# Patient Record
Sex: Female | Born: 1935 | Race: White | Hispanic: No | Marital: Married | Smoking: Never smoker
Health system: Southern US, Community
[De-identification: ages and names within clinical notes are randomized; demographics above are authoritative.]

## PROBLEM LIST (undated history)

## (undated) DIAGNOSIS — T4145XA Adverse effect of unspecified anesthetic, initial encounter: Secondary | ICD-10-CM

## (undated) DIAGNOSIS — R0989 Other specified symptoms and signs involving the circulatory and respiratory systems: Secondary | ICD-10-CM

## (undated) DIAGNOSIS — E785 Hyperlipidemia, unspecified: Secondary | ICD-10-CM

## (undated) DIAGNOSIS — I10 Essential (primary) hypertension: Secondary | ICD-10-CM

## (undated) DIAGNOSIS — F0390 Unspecified dementia without behavioral disturbance: Secondary | ICD-10-CM

## (undated) DIAGNOSIS — I209 Angina pectoris, unspecified: Secondary | ICD-10-CM

## (undated) DIAGNOSIS — D649 Anemia, unspecified: Secondary | ICD-10-CM

## (undated) DIAGNOSIS — I259 Chronic ischemic heart disease, unspecified: Secondary | ICD-10-CM

## (undated) DIAGNOSIS — T8859XA Other complications of anesthesia, initial encounter: Secondary | ICD-10-CM

## (undated) HISTORY — PX: SKIN CANCER EXCISION: SHX779

## (undated) HISTORY — PX: NASAL RECONSTRUCTION: SHX2069

## (undated) HISTORY — DX: Chronic ischemic heart disease, unspecified: I25.9

## (undated) HISTORY — PX: CORONARY ARTERY BYPASS GRAFT: SHX141

## (undated) HISTORY — DX: Unspecified dementia, unspecified severity, without behavioral disturbance, psychotic disturbance, mood disturbance, and anxiety: F03.90

## (undated) HISTORY — PX: COLON SURGERY: SHX602

## (undated) HISTORY — DX: Other specified symptoms and signs involving the circulatory and respiratory systems: R09.89

## (undated) HISTORY — DX: Essential (primary) hypertension: I10

## (undated) HISTORY — PX: OTHER SURGICAL HISTORY: SHX169

## (undated) HISTORY — DX: Hyperlipidemia, unspecified: E78.5

---

## 2005-02-01 ENCOUNTER — Emergency Department (HOSPITAL_COMMUNITY): Admission: EM | Admit: 2005-02-01 | Discharge: 2005-02-02 | Payer: Self-pay | Admitting: Emergency Medicine

## 2005-08-09 ENCOUNTER — Emergency Department (HOSPITAL_COMMUNITY): Admission: EM | Admit: 2005-08-09 | Discharge: 2005-08-09 | Payer: Self-pay | Admitting: Emergency Medicine

## 2006-05-13 ENCOUNTER — Encounter: Admission: RE | Admit: 2006-05-13 | Discharge: 2006-05-13 | Payer: Self-pay | Admitting: Cardiology

## 2009-06-11 ENCOUNTER — Encounter: Admission: RE | Admit: 2009-06-11 | Discharge: 2009-06-11 | Payer: Self-pay | Admitting: Cardiology

## 2010-11-25 ENCOUNTER — Ambulatory Visit (INDEPENDENT_AMBULATORY_CARE_PROVIDER_SITE_OTHER): Payer: Medicare Other | Admitting: Cardiology

## 2010-11-25 DIAGNOSIS — E7800 Pure hypercholesterolemia, unspecified: Secondary | ICD-10-CM

## 2010-11-25 DIAGNOSIS — Z79899 Other long term (current) drug therapy: Secondary | ICD-10-CM

## 2010-11-25 DIAGNOSIS — E119 Type 2 diabetes mellitus without complications: Secondary | ICD-10-CM

## 2011-05-05 ENCOUNTER — Other Ambulatory Visit: Payer: Self-pay | Admitting: *Deleted

## 2011-05-05 DIAGNOSIS — E785 Hyperlipidemia, unspecified: Secondary | ICD-10-CM

## 2011-05-05 MED ORDER — EZETIMIBE 10 MG PO TABS: 10.0000 mg | ORAL_TABLET | Freq: Every day | ORAL | Status: DC

## 2011-05-05 NOTE — Telephone Encounter (Signed)
Refilled zetia to [facility]

## 2011-05-07 ENCOUNTER — Ambulatory Visit (INDEPENDENT_AMBULATORY_CARE_PROVIDER_SITE_OTHER): Payer: Medicare Other

## 2011-05-07 ENCOUNTER — Inpatient Hospital Stay (INDEPENDENT_AMBULATORY_CARE_PROVIDER_SITE_OTHER)
Admission: RE | Admit: 2011-05-07 | Discharge: 2011-05-07 | Disposition: A | Discharge status: Home / Self Care | Payer: Medicare Other | Source: Ambulatory Visit | Attending: Emergency Medicine | Admitting: Emergency Medicine

## 2011-05-07 DIAGNOSIS — S60219A Contusion of unspecified wrist, initial encounter: Secondary | ICD-10-CM

## 2011-05-20 ENCOUNTER — Telehealth: Payer: Self-pay | Admitting: Cardiology

## 2011-05-20 NOTE — Telephone Encounter (Signed)
Last OV Note, latest labs available all faxed today

## 2011-05-20 NOTE — Telephone Encounter (Signed)
Need to fax OV Note, latest labs

## 2011-05-22 ENCOUNTER — Telehealth: Payer: Self-pay | Admitting: Cardiology

## 2011-05-22 NOTE — Telephone Encounter (Signed)
Heather at Dr. Debro[address] office called stating she did not receive last OV and Labs.  Patient has appointment on Monday and they are closed on Friday 05/23/2011.  Please refax to [phone].

## 2011-05-23 NOTE — Telephone Encounter (Signed)
Refaxed last OV Note and Labs available today to [phone].

## 2011-07-07 ENCOUNTER — Other Ambulatory Visit: Payer: Self-pay | Admitting: *Deleted

## 2011-07-08 ENCOUNTER — Other Ambulatory Visit: Payer: Self-pay | Admitting: *Deleted

## 2011-07-08 MED ORDER — FENOFIBRATE 160 MG PO TABS: 160.0000 mg | ORAL_TABLET | Freq: Every day | ORAL | Status: DC

## 2011-08-07 ENCOUNTER — Other Ambulatory Visit: Payer: Self-pay | Admitting: Cardiology

## 2011-09-15 ENCOUNTER — Encounter: Payer: Self-pay | Admitting: *Deleted

## 2011-09-15 ENCOUNTER — Other Ambulatory Visit: Payer: Self-pay

## 2011-09-15 ENCOUNTER — Emergency Department (HOSPITAL_COMMUNITY): Payer: Medicare Other

## 2011-09-15 ENCOUNTER — Inpatient Hospital Stay (HOSPITAL_COMMUNITY)
Admission: EM | Admit: 2011-09-15 | Discharge: 2011-09-22 | DRG: 481 | Disposition: A | Discharge status: Skilled Nursing Facility | Payer: Medicare Other | Attending: Internal Medicine | Admitting: Internal Medicine

## 2011-09-15 DIAGNOSIS — N179 Acute kidney failure, unspecified: Secondary | ICD-10-CM | POA: Diagnosis present

## 2011-09-15 DIAGNOSIS — S72141A Displaced intertrochanteric fracture of right femur, initial encounter for closed fracture: Secondary | ICD-10-CM | POA: Diagnosis present

## 2011-09-15 DIAGNOSIS — F05 Delirium due to known physiological condition: Secondary | ICD-10-CM | POA: Diagnosis present

## 2011-09-15 DIAGNOSIS — F0150 Vascular dementia without behavioral disturbance: Secondary | ICD-10-CM | POA: Diagnosis present

## 2011-09-15 DIAGNOSIS — I129 Hypertensive chronic kidney disease with stage 1 through stage 4 chronic kidney disease, or unspecified chronic kidney disease: Secondary | ICD-10-CM | POA: Diagnosis present

## 2011-09-15 DIAGNOSIS — R2681 Unsteadiness on feet: Secondary | ICD-10-CM | POA: Diagnosis present

## 2011-09-15 DIAGNOSIS — I2510 Atherosclerotic heart disease of native coronary artery without angina pectoris: Secondary | ICD-10-CM | POA: Diagnosis present

## 2011-09-15 DIAGNOSIS — D631 Anemia in chronic kidney disease: Secondary | ICD-10-CM | POA: Diagnosis present

## 2011-09-15 DIAGNOSIS — S72001A Fracture of unspecified part of neck of right femur, initial encounter for closed fracture: Secondary | ICD-10-CM

## 2011-09-15 DIAGNOSIS — E870 Hyperosmolality and hypernatremia: Secondary | ICD-10-CM | POA: Diagnosis present

## 2011-09-15 DIAGNOSIS — Z951 Presence of aortocoronary bypass graft: Secondary | ICD-10-CM

## 2011-09-15 DIAGNOSIS — Z9861 Coronary angioplasty status: Secondary | ICD-10-CM

## 2011-09-15 DIAGNOSIS — D649 Anemia, unspecified: Secondary | ICD-10-CM | POA: Diagnosis present

## 2011-09-15 DIAGNOSIS — Y92009 Unspecified place in unspecified non-institutional (private) residence as the place of occurrence of the external cause: Secondary | ICD-10-CM

## 2011-09-15 DIAGNOSIS — W010XXA Fall on same level from slipping, tripping and stumbling without subsequent striking against object, initial encounter: Secondary | ICD-10-CM | POA: Diagnosis present

## 2011-09-15 DIAGNOSIS — E876 Hypokalemia: Secondary | ICD-10-CM | POA: Diagnosis not present

## 2011-09-15 DIAGNOSIS — N183 Chronic kidney disease, stage 3 unspecified: Secondary | ICD-10-CM | POA: Diagnosis present

## 2011-09-15 DIAGNOSIS — I25810 Atherosclerosis of coronary artery bypass graft(s) without angina pectoris: Secondary | ICD-10-CM | POA: Diagnosis present

## 2011-09-15 DIAGNOSIS — Z66 Do not resuscitate: Secondary | ICD-10-CM | POA: Diagnosis present

## 2011-09-15 DIAGNOSIS — F0151 Vascular dementia with behavioral disturbance: Secondary | ICD-10-CM | POA: Diagnosis present

## 2011-09-15 DIAGNOSIS — I672 Cerebral atherosclerosis: Secondary | ICD-10-CM | POA: Diagnosis present

## 2011-09-15 DIAGNOSIS — S72143A Displaced intertrochanteric fracture of unspecified femur, initial encounter for closed fracture: Principal | ICD-10-CM | POA: Diagnosis present

## 2011-09-15 DIAGNOSIS — E119 Type 2 diabetes mellitus without complications: Secondary | ICD-10-CM | POA: Diagnosis present

## 2011-09-15 DIAGNOSIS — I10 Essential (primary) hypertension: Secondary | ICD-10-CM | POA: Diagnosis present

## 2011-09-15 HISTORY — DX: Adverse effect of unspecified anesthetic, initial encounter: T41.45XA

## 2011-09-15 HISTORY — DX: Other complications of anesthesia, initial encounter: T88.59XA

## 2011-09-15 HISTORY — DX: Angina pectoris, unspecified: I20.9

## 2011-09-15 HISTORY — DX: Anemia, unspecified: D64.9

## 2011-09-15 LAB — COMPREHENSIVE METABOLIC PANEL
ALT: 21 U/L (ref 0–35)
AST: 43 U/L — ABNORMAL HIGH (ref 0–37)
Albumin: 2.8 g/dL — ABNORMAL LOW (ref 3.5–5.2)
Alkaline Phosphatase: 51 U/L (ref 39–117)
BUN: 40 mg/dL — ABNORMAL HIGH (ref 6–23)
CO2: 24 mEq/L (ref 19–32)
Calcium: 9.3 mg/dL (ref 8.4–10.5)
Creatinine, Ser: 1.03 mg/dL (ref 0.50–1.10)
GFR calc Af Amer: 53 mL/min — ABNORMAL LOW (ref >90)
GFR calc non Af Amer: 46 mL/min — ABNORMAL LOW (ref >90)
Glucose, Bld: 242 mg/dL — ABNORMAL HIGH (ref 70–99)
Potassium: 3.7 mEq/L (ref 3.5–5.1)
Sodium: 146 mEq/L — ABNORMAL HIGH (ref 135–145)
Total Bilirubin: 0.2 mg/dL — ABNORMAL LOW (ref 0.3–1.2)
Total Protein: 6.2 g/dL (ref 6.0–8.3)

## 2011-09-15 LAB — URINALYSIS, ROUTINE W REFLEX MICROSCOPIC
Bilirubin Urine: NEGATIVE
Hgb urine dipstick: NEGATIVE
Leukocytes, UA: NEGATIVE
Nitrite: NEGATIVE
Specific Gravity, Urine: 1.021 (ref 1.005–1.030)
Urobilinogen, UA: 0.2 mg/dL (ref 0.0–1.0)
pH: 5 (ref 5.0–8.0)

## 2011-09-15 LAB — DIFFERENTIAL
Basophils Absolute: 0 10*3/uL (ref 0.0–0.1)
Basophils Relative: 0 % (ref 0–1)
Eosinophils Absolute: 0 10*3/uL (ref 0.0–0.7)
Eosinophils Relative: 0 % (ref 0–5)
Lymphocytes Relative: 16 % (ref 12–46)
Lymphs Abs: 1.2 10*3/uL (ref 0.7–4.0)
Monocytes Absolute: 0.8 10*3/uL (ref 0.1–1.0)
Monocytes Relative: 11 % (ref 3–12)
Neutro Abs: 5.6 10*3/uL (ref 1.7–7.7)

## 2011-09-15 LAB — CBC
HCT: 31.9 % — ABNORMAL LOW (ref 36.0–46.0)
MCH: 29.2 pg (ref 26.0–34.0)
MCV: 88.9 fL (ref 78.0–100.0)
Platelets: 208 10*3/uL (ref 150–400)
RBC: 3.59 MIL/uL — ABNORMAL LOW (ref 3.87–5.11)
RDW: 13.5 % (ref 11.5–15.5)
WBC: 7.7 10*3/uL (ref 4.0–10.5)

## 2011-09-15 LAB — URINE MICROSCOPIC-ADD ON

## 2011-09-15 LAB — POCT I-STAT TROPONIN I

## 2011-09-15 LAB — CARDIAC PANEL(CRET KIN+CKTOT+MB+TROPI)
CK, MB: 2.6 ng/mL (ref 0.3–4.0)
Relative Index: 1.6 (ref 0.0–2.5)
Troponin I: <0.3 ng/mL (ref <0.30)

## 2011-09-15 MED ORDER — VITAMINS A & D EX OINT
TOPICAL_OINTMENT | CUTANEOUS | Status: AC
  Administered 2011-09-15: 22:00:00
  Filled 2011-09-15: qty 5

## 2011-09-15 MED ORDER — INSULIN ASPART 100 UNIT/ML ~~LOC~~ SOLN
0.0000 [IU] | SUBCUTANEOUS | Status: DC
  Administered 2011-09-15 – 2011-09-16 (×2): 2 [IU] via SUBCUTANEOUS
  Administered 2011-09-16: 1 [IU] via SUBCUTANEOUS
  Administered 2011-09-16: 3 [IU] via SUBCUTANEOUS
  Administered 2011-09-17 (×4): 2 [IU] via SUBCUTANEOUS
  Filled 2011-09-15 (×3): qty 3

## 2011-09-15 MED ORDER — ACETAMINOPHEN 650 MG RE SUPP: 650.0000 mg | Freq: Four times a day (QID) | RECTAL | Status: DC | PRN

## 2011-09-15 MED ORDER — LORAZEPAM 2 MG/ML IJ SOLN
INTRAMUSCULAR | Status: AC
  Filled 2011-09-15: qty 1

## 2011-09-15 MED ORDER — ONDANSETRON HCL 4 MG/2ML IJ SOLN: 4.0000 mg | Freq: Four times a day (QID) | INTRAMUSCULAR | Status: DC | PRN

## 2011-09-15 MED ORDER — LORAZEPAM 2 MG/ML IJ SOLN
0.5000 mg | Freq: Once | INTRAMUSCULAR | Status: AC
  Administered 2011-09-15: 0.5 mg via INTRAVENOUS

## 2011-09-15 MED ORDER — HYDRALAZINE HCL 20 MG/ML IJ SOLN
5.0000 mg | INTRAMUSCULAR | Status: DC | PRN
  Administered 2011-09-15 – 2011-09-20 (×12): 5 mg via INTRAVENOUS
  Filled 2011-09-15 (×12): qty 1

## 2011-09-15 MED ORDER — MORPHINE SULFATE 2 MG/ML IJ SOLN
2.0000 mg | INTRAMUSCULAR | Status: DC | PRN
  Filled 2011-09-15: qty 1

## 2011-09-15 MED ORDER — SODIUM CHLORIDE 0.9 % IV SOLN: Freq: Once | INTRAVENOUS | Status: DC

## 2011-09-15 MED ORDER — ACETAMINOPHEN 325 MG PO TABS: 650.0000 mg | ORAL_TABLET | Freq: Four times a day (QID) | ORAL | Status: DC | PRN

## 2011-09-15 MED ORDER — PANTOPRAZOLE SODIUM 40 MG IV SOLR
40.0000 mg | Freq: Every day | INTRAVENOUS | Status: DC
  Administered 2011-09-15 – 2011-09-19 (×5): 40 mg via INTRAVENOUS
  Filled 2011-09-15 (×8): qty 40

## 2011-09-15 MED ORDER — ONDANSETRON HCL 4 MG/2ML IJ SOLN
INTRAMUSCULAR | Status: AC
  Filled 2011-09-15: qty 2

## 2011-09-15 MED ORDER — LORAZEPAM 2 MG/ML IJ SOLN
1.0000 mg | INTRAMUSCULAR | Status: DC | PRN
  Filled 2011-09-15: qty 1

## 2011-09-15 MED ORDER — METOPROLOL TARTRATE 1 MG/ML IV SOLN
5.0000 mg | Freq: Four times a day (QID) | INTRAVENOUS | Status: DC
  Administered 2011-09-15 – 2011-09-16 (×2): 5 mg via INTRAVENOUS
  Filled 2011-09-15 (×7): qty 5

## 2011-09-15 MED ORDER — LORAZEPAM 0.5 MG PO TABS: 0.5000 mg | ORAL_TABLET | Freq: Once | ORAL | Status: DC

## 2011-09-15 MED ORDER — MEMANTINE HCL 10 MG PO TABS
10.0000 mg | ORAL_TABLET | Freq: Two times a day (BID) | ORAL | Status: DC
  Administered 2011-09-16 – 2011-09-22 (×12): 10 mg via ORAL
  Filled 2011-09-15 (×17): qty 1

## 2011-09-15 MED ORDER — MORPHINE SULFATE 2 MG/ML IJ SOLN
2.0000 mg | INTRAMUSCULAR | Status: DC | PRN
  Administered 2011-09-15: 2 mg via INTRAVENOUS
  Filled 2011-09-15: qty 1

## 2011-09-15 MED ORDER — NALOXONE HCL 1 MG/ML IJ SOLN
INTRAMUSCULAR | Status: AC
  Filled 2011-09-15: qty 2

## 2011-09-15 MED ORDER — DEXTROSE-NACL 5-0.9 % IV SOLN
INTRAVENOUS | Status: DC
  Administered 2011-09-16: 03:00:00 via INTRAVENOUS

## 2011-09-15 MED ORDER — ALBUTEROL SULFATE (5 MG/ML) 0.5% IN NEBU: 2.5000 mg | INHALATION_SOLUTION | RESPIRATORY_TRACT | Status: DC | PRN

## 2011-09-15 MED ORDER — ONDANSETRON HCL 4 MG PO TABS: 4.0000 mg | ORAL_TABLET | Freq: Four times a day (QID) | ORAL | Status: DC | PRN

## 2011-09-15 NOTE — ED Notes (Signed)
Per daughter pt fell on Friday on wood floor. Pt c/o right hip pain and has been in bed since. Pt has shortening and rotation to right hip. Pt given 100 mcg of Fentanyl and 4mg  Zofran IVP in route. Pt had period of apnea and given Narcan 2mg  IVP. Pt's vital signs per EMS 200/90BP, 95HR, 16R, 98% RA. Alert and oriented per norm.

## 2011-09-15 NOTE — H&P (Signed)
PCP:   Drucie Opitz (patient was seeing Dr. Patty Sermons up until the last few months)  Chief Complaint:  Fall  HPI: Patient is a 77 year old white female with past oral history of diabetes, hypertension, CAD and advanced Alzheimer's dementia who lives at home with her daughter and comes in after she had a recent fall. Normally the patient is able to ambulate without any assistance or use of a walker, but she had a hard fall approximately one to 2 weeks ago landing on the wooden floor. The daughter feels that since that time she's gotten weaker and has been using a walker since. She's had a few near falls, but this culminated with a bad fall on her right side on Friday, 3 days ago. The daughter was initially resistant to bring in the patient, but she noted that her mom seemed beginning weaker and more confused. She also noted that her mom looked to have some garbled speech. Patient was brought into the emergency room for further evaluation.  In the emergency room, patient was found by x-ray to have a right intertrochanteric comminuted fracture. Her CT scan of the head was negative for any stroke, only showing signs of chronic wear and tear. Her labs noted a sodium of 146, a glucose of 242 and a troponin of 0.16. Please note the troponin was a point-of-care marker. Regular cardiac markers have been ordered and the results are pending.  Review of Systems:  When I saw the patient, she looks to be somewhat delirious and her speech is quite garbled. She interacts with me a little, but does not follow any commands. Unable to get any kind of review of systems. The entire history is provided by her daughter.  Past Medical History: Past Medical History  Diagnosis Date  . Angina   . Anemia   . Diabetes mellitus    Past Surgical History  Procedure Date  . Coronary artery bypass graft     1984  . Coronary stents     4 all together  . Colon surgery approx. 1992    colon tumor benign  . Skin cancer  excision     face - squamous cell  . Nasal reconstruction     rebuilt due to squamous cell ca    Medications: Prior to Admission medications   Medication Sig Start Date End Date Taking? Authorizing Provider  B Complex Vitamins (VITAMIN B COMPLEX PO) Take 1 tablet by mouth daily.     Yes Historical Provider, MD  BIOTIN PO Take 1 tablet by mouth daily.     Yes Historical Provider, MD  Coenzyme Q10 (COQ10 PO) Take 1 tablet by mouth daily.     Yes Historical Provider, MD  diltiazem (DILACOR XR) 180 MG 24 hr capsule Take 180 mg by mouth daily.     Yes Historical Provider, MD  enalapril (VASOTEC) 20 MG tablet Take 20 mg by mouth 2 (two) times daily.     Yes Historical Provider, MD  fenofibrate 160 MG tablet Take 1 tablet (160 mg total) by mouth daily. 07/08/11  Yes Cassell Clement, MD  insulin glargine (LANTUS) 100 UNIT/ML injection Inject 10 Units into the skin at bedtime.     Yes Historical Provider, MD  metoprolol tartrate (LOPRESSOR) 25 MG tablet Take 25-50 mg by mouth 2 (two) times daily. 2 in am, 1 in pm    Yes Historical Provider, MD  NAMENDA 10 MG tablet TAKE 1 TABLET TWICE DAILY. 08/07/11  Yes Cassell Clement, MD  nitroGLYCERIN (  NITRODUR - DOSED IN MG/24 HR) 0.4 mg/hr Place 3 patches onto the skin daily.     Yes Historical Provider, MD  nitroGLYCERIN (NITROSTAT) 0.4 MG SL tablet Place 0.4 mg under the tongue every 5 (five) minutes x 3 doses as needed. For chest pain.    Yes Historical Provider, MD  omeprazole (PRILOSEC) 20 MG capsule Take 20 mg by mouth daily.     Yes Historical Provider, MD    Allergies:  No Known Allergies  Social History:  reports that she has never smoked. She has never used smokeless tobacco. She reports that she does not drink alcohol or use illicit drugs. the patient lives at home with her daughter. Prior to these falls, she was able to ambulate comfortably without any assistance.  Family History: Dementia, CAD  Physical Exam: Filed Vitals:   09/15/11 1244  09/15/11 1245  BP: 172/81   Pulse: 117   Temp: 98.4 F (36.9 C)   TempSrc: Oral   Resp: 16   SpO2: 85% 92%   General: Alert but only oriented to self. Does not look to be in any acute distress. Looks about stated age. HEENT: Normocephalic, atraumatic, mucous membranes are quite dry. She looks to have some facial droop to the left. Cardiovascular: Regular rate and rhythm, S1-S2, 2/6 systolic ejection murmur Lungs: Clear to auscultation bilaterally Abdomen: Soft, nontender, nondistended, normoactive bowel sounds Extremities: No clubbing or cyanosis, trace pitting edema bilaterally. Musculoskeletal exam deferred secondary to hip fracture.   Labs on Admission:   [facility] 09/15/11 1332  NA 146*  K 3.7  CL 112  CO2 24  GLUCOSE 242*  BUN 40*  CREATININE 1.03  CALCIUM 9.3  MG --  PHOS --    Basename 09/15/11 1332  AST 43*  ALT 21  ALKPHOS 51  BILITOT 0.2*  PROT 6.2  ALBUMIN 2.8*    Basename 09/15/11 1332  WBC 7.7  NEUTROABS 5.6  HGB 10.5*  HCT 31.9*  MCV 88.9  PLT 208    Basename 09/15/11 1720 09/15/11 1339  CKTOTAL 162 158  CKMB 2.6 --  CKMBINDEX -- --  TROPONINI <0.30 --    Radiological Exams on Admission: Dg Chest 1 View 09/15/2011   IMPRESSION: Prior CABG.  Chronic bronchitic changes.   Dg Hip Complete Right 12/17/2012IMPRESSION: Comminuted right intertrochanteric fracture with varus angulation.  Ct Head Wo Contrast 09/15/2011  IMPRESSION: Stable atrophy, chronic microvascular ischemic changes and ventricular enlargement.  No definite acute intracranial process by CT.     Assessment/Plan Present on Admission:  .CAD (coronary artery disease) of artery bypass graft: Stable. I think that her troponin is likely elevated secondary to dehydration and the most minimal of renal failure. We'll plan to hydrate and recheck serial enzymes.  .Acute hypernatremia: Secondary to fall and subsequent poor by mouth intake with dehydration. Hydrating. Recheck labs in  the morning.  Marland KitchenUnsteady gait: Post surgery, she'll need rehabilitation.  Marland KitchenHTN (hypertension), benign: Holding all by mouth medications, treating with IV Lopressor.  .DM type 2 (diabetes mellitus, type 2): Checking hemoglobin A1c, every 4 hours sliding scale. Please note normally the patient is diet controlled for her diabetes.  .Dementia, vascular with delirium: Watch for acute agitation. Her normal baseline is no difficulty in swallowing  .Closed intertrochanteric fracture of right hip: Dr. Toni Arthurs from orthopedic surgery has been consult and will see the patient. According to the ER physician, he plans to take the patient to the OR she is cleared from a medical standpoint tomorrow afternoon.  At this time, I am not seeing anything which would prevent the patient from having surgery. She does have some risk factors given her advanced age and previous cardiac history, but the benefits far outweigh the risks and her mortality would be very high if she did not have the surgery. We'll cycle enzymes and contact Dr. Yevonne Pax office to see if we can get results of an echocardiogram.  In discussion with the patient's daughter, she clarified that the patient is to be a DO NOT RESUSCITATE. Anticipate if there are no other complications, the patient can hopefully go to a short-term skilled nursing facility by the end of the week. Nesha Counihan K 09/15/2011, 7:17 PM

## 2011-09-15 NOTE — Consult Note (Signed)
Reason for Consult:R hip fx Referring Physician: Dr. Darleen Crocker Shelby Burns is an 77 y.o. female.  HPI:  77 y/o female with PMH of CAD, DM and advanced dementia.  Pt fell at home on Friday.  Unable to get around all weekend.  Brought to ED by EMS today.  C/o pain in R hip with any attempt at motion.  Past Medical History  Diagnosis Date  . Angina   . Anemia   . Diabetes mellitus   . Complication of anesthesia     ultra sensitive    Past Surgical History  Procedure Date  . Coronary artery bypass graft     1984  . Coronary stents     4 all together  . Colon surgery approx. 1992    colon tumor benign  . Skin cancer excision     face - squamous cell  . Nasal reconstruction     rebuilt due to squamous cell ca    FH:  Parents and grandparents lived to 75s to 100s.  Social History:  reports that she has never smoked. She has never used smokeless tobacco. She reports that she does not drink alcohol or use illicit drugs.  Allergies: No Known Allergies  Medications: I have reviewed the patient's current medications.  Results for orders placed during the hospital encounter of 09/15/11 (from the past 48 hour(s))  CBC     Status: Abnormal   Collection Time   09/15/11  1:32 PM      Component Value Range Comment   WBC 7.7  4.0 - 10.5 (K/uL)    RBC 3.59 (*) 3.87 - 5.11 (MIL/uL)    Hemoglobin 10.5 (*) 12.0 - 15.0 (g/dL)    HCT 04.5 (*) 40.9 - 46.0 (%)    MCV 88.9  78.0 - 100.0 (fL)    MCH 29.2  26.0 - 34.0 (pg)    MCHC 32.9  30.0 - 36.0 (g/dL)    RDW 81.1  91.4 - 78.2 (%)    Platelets 208  150 - 400 (K/uL)   DIFFERENTIAL     Status: Normal   Collection Time   09/15/11  1:32 PM      Component Value Range Comment   Neutrophils Relative 73  43 - 77 (%)    Neutro Abs 5.6  1.7 - 7.7 (K/uL)    Lymphocytes Relative 16  12 - 46 (%)    Lymphs Abs 1.2  0.7 - 4.0 (K/uL)    Monocytes Relative 11  3 - 12 (%)    Monocytes Absolute 0.8  0.1 - 1.0 (K/uL)    Eosinophils Relative 0  0  - 5 (%)    Eosinophils Absolute 0.0  0.0 - 0.7 (K/uL)    Basophils Relative 0  0 - 1 (%)    Basophils Absolute 0.0  0.0 - 0.1 (K/uL)   COMPREHENSIVE METABOLIC PANEL     Status: Abnormal   Collection Time   09/15/11  1:32 PM      Component Value Range Comment   Sodium 146 (*) 135 - 145 (mEq/L)    Potassium 3.7  3.5 - 5.1 (mEq/L)    Chloride 112  96 - 112 (mEq/L)    CO2 24  19 - 32 (mEq/L)    Glucose, Bld 242 (*) 70 - 99 (mg/dL)    BUN 40 (*) 6 - 23 (mg/dL)    Creatinine, Ser 9.56  0.50 - 1.10 (mg/dL)    Calcium 9.3  8.4 - 10.5 (mg/dL)  Total Protein 6.2  6.0 - 8.3 (g/dL)    Albumin 2.8 (*) 3.5 - 5.2 (g/dL)    AST 43 (*) 0 - 37 (U/L)    ALT 21  0 - 35 (U/L)    Alkaline Phosphatase 51  39 - 117 (U/L)    Total Bilirubin 0.2 (*) 0.3 - 1.2 (mg/dL)    GFR calc non Af Amer 46 (*) >90 (mL/min)    GFR calc Af Amer 53 (*) >90 (mL/min)   CK     Status: Normal   Collection Time   09/15/11  1:39 PM      Component Value Range Comment   Total CK 158  7 - 177 (U/L)   URINALYSIS, ROUTINE W REFLEX MICROSCOPIC     Status: Abnormal   Collection Time   09/15/11  2:11 PM      Component Value Range Comment   Color, Urine YELLOW  YELLOW     APPearance CLOUDY (*) CLEAR     Specific Gravity, Urine 1.021  1.005 - 1.030     pH 5.0  5.0 - 8.0     Glucose, UA 250 (*) NEGATIVE (mg/dL)    Hgb urine dipstick NEGATIVE  NEGATIVE     Bilirubin Urine NEGATIVE  NEGATIVE     Ketones, ur NEGATIVE  NEGATIVE (mg/dL)    Protein, ur 30 (*) NEGATIVE (mg/dL)    Urobilinogen, UA 0.2  0.0 - 1.0 (mg/dL)    Nitrite NEGATIVE  NEGATIVE     Leukocytes, UA NEGATIVE  NEGATIVE    URINE MICROSCOPIC-ADD ON     Status: Abnormal   Collection Time   09/15/11  2:11 PM      Component Value Range Comment   Bacteria, UA FEW (*) RARE     Casts HYALINE CASTS (*) NEGATIVE     Urine-Other AMORPHOUS URATES/PHOSPHATES   MUCOUS PRESENT  POCT I-STAT TROPONIN I     Status: Abnormal   Collection Time   09/15/11  2:14 PM      Component  Value Range Comment   Troponin i, poc 0.16 (*) 0.00 - 0.08 (ng/mL)    Comment NOTIFIED PHYSICIAN      Comment 3            CARDIAC PANEL(CRET KIN+CKTOT+MB+TROPI)     Status: Normal   Collection Time   09/15/11  5:20 PM      Component Value Range Comment   Total CK 162  7 - 177 (U/L)    CK, MB 2.6  0.3 - 4.0 (ng/mL)    Troponin I <0.30  <0.30 (ng/mL)    Relative Index 1.6  0.0 - 2.5      Dg Chest 1 View  09/15/2011  *RADIOLOGY REPORT*  Clinical Data: Fall, right hip pain.  CHEST - 1 VIEW  Comparison: 11/11 of six  Findings: Prior CABG.  Chronic peribronchial thickening and interstitial prominence, likely chronic bronchitic changes.  No confluent opacities or effusions.  No acute bony abnormality.  IMPRESSION: Prior CABG.  Chronic bronchitic changes.  Original Report Authenticated By: Cyndie Chime, M.D.   Dg Hip Complete Right  09/15/2011  *RADIOLOGY REPORT*  Clinical Data: Fall, hip pain.  RIGHT HIP - COMPLETE 2+ VIEW  Comparison: None  Findings: There is a comminuted right intertrochanteric femoral fracture.  Mild varus angulation and displacement fracture fragments.  IMPRESSION: Comminuted right intertrochanteric fracture with varus angulation.  Original Report Authenticated By: Cyndie Chime, M.D.   Ct Head Wo Contrast  09/15/2011  *RADIOLOGY REPORT*  Clinical Data: Fall, dementia  CT HEAD WITHOUT CONTRAST  Technique:  Contiguous axial images were obtained from the base of the skull through the vertex without contrast.  Comparison: 06/11/2009  Findings: Somewhat limited because of oblique scan acquisition. Diffuse brain atrophy noted with associated periventricular white matter microvascular ischemic changes and ventricular enlargement. No acute intracranial hemorrhage, definite acute infarction, focal edema, mass lesion, midline shift, herniation, hydrocephalus, or extra-axial fluid collection.  No definite cerebellar abnormality. Atherosclerosis of the intracranial vessels.  Mastoids  and sinuses appear clear.  IMPRESSION: Stable atrophy, chronic microvascular ischemic changes and ventricular enlargement.  No definite acute intracranial process by CT.  Original Report Authenticated By: Judie Petit. TREVOR SHICK, M.D.    ROS:  No recent f/c/n/v.  ROS as above and o/w neg.  Blood pressure 172/81, pulse 117, temperature 98.4 F (36.9 C), temperature source Oral, resp. rate 16, SpO2 92.00%.  PE:  Elderly female resting comfortably in ER.  Alert.  Not oriented.  Able to answer questions but difficult to understand.  EOMI.  Respirations unlabored.  R LE shortened and externally rotated.  Skin healthy and intact.  Palpable DP and PT pulses.  Active PF and DF at toes.  Feels LT at foot. Assessment/Plan: R hip comminuted displaced IT fx.  To OR tomorrow for IM nail.  Pt's daughter understnads the risks and benefits of the alternative treatment options and elects to proceed with surgical treatment.  Shelby Burns 09/15/2011, 7:34 PM

## 2011-09-15 NOTE — ED Notes (Signed)
MD at bedside. Ortho MD at bedside. Family at bedside.

## 2011-09-15 NOTE — ED Provider Notes (Signed)
History     CSN: [id number] Arrival date & time: 09/15/2011 12:35 PM   First MD Initiated Contact with Patient 09/15/11 1237      Chief Complaint  Patient presents with  . Hip Pain  . Fall    (Consider location/radiation/quality/duration/timing/severity/associated sxs/prior treatment) HPI History was obtained from the patient's daughter and EMS, as pt has dementia. Patient presents from home. She lives with her daughter. She apparently fell on Friday, landing on a wood floor and striking her right hip. She has been in bed much of the weekend. She had not been complaining of pain until this morning when her daughter was bathing her. She has not been able to bear weight on the right lower extremity and is normally ambulatory at baseline.  Past medical history significant for coronary artery disease, status post CABG, hypertension, dementia. Daughter states that patient has seemed a little more confused than her baseline over the past 24 hours; her speech is normally intelligible, but seems more garbled than usual.  No past medical history on file.  No past surgical history on file.  No family history on file.  History  Substance Use Topics  . Smoking status: Not on file  . Smokeless tobacco: Not on file  . Alcohol Use: Not on file    OB History    Grav Para Term Preterm Abortions TAB SAB Ect Mult Living                  Review of Systems  Unable to perform ROS: Dementia    Allergies  Review of patient's allergies indicates no known allergies.  Home Medications   Current Outpatient Rx  Name Route Sig Dispense Refill  . EZETIMIBE 10 MG PO TABS Oral Take 1 tablet (10 mg total) by mouth daily. 30 tablet 11  . FENOFIBRATE 160 MG PO TABS Oral Take 1 tablet (160 mg total) by mouth daily. 90 tablet 3  . NAMENDA 10 MG PO TABS  TAKE 1 TABLET TWICE DAILY. 60 each 3    BP 172/81  Pulse 117  Temp(Src) 98.4 F (36.9 C) (Oral)  Resp 16  SpO2 92%  Physical Exam  Nursing  note and vitals reviewed. Constitutional: She appears well-developed and well-nourished. She has a sickly appearance. No distress.       Pt not able to cooperate with exam  HENT:  Head: Normocephalic and atraumatic.  Right Ear: External ear normal.  Left Ear: External ear normal.  Eyes: Conjunctivae are normal. Pupils are equal, round, and reactive to light.  Neck: Normal range of motion. Neck supple.  Cardiovascular: Normal rate, regular rhythm and normal heart sounds.   Pulmonary/Chest: Effort normal. No respiratory distress. She exhibits no tenderness.       Scattered rhonchi at bases bilaterally  Abdominal: Soft. Bowel sounds are normal. There is no tenderness. There is no rebound and no guarding.  Musculoskeletal: Normal range of motion.       Right lower extremity: Obvious rotation and shortening of the extremity. Patient does not grimace when the hip is palpated. Dorsalis pedis and posterior tibial pulses intact. Capillary refill less than 3 seconds.  Neurological: She is alert.       Patient confused; speech unintelligible  Skin: Skin is warm and dry. She is not diaphoretic.    ED Course  Procedures (including critical care time)   Date: 09/15/2011  Rate: 82  Rhythm: normal sinus rhythm  QRS Axis: normal  Intervals: normal  ST/T Wave abnormalities:  some T inversion in lat leads  Conduction Disutrbances:none  Narrative Interpretation: LAA, probable LVH  Old EKG Reviewed: compared with 08/09/2005, lat T inversions not seen    Labs Reviewed  CBC - Abnormal; Notable for the following:    RBC 3.59 (*)    Hemoglobin 10.5 (*)    HCT 31.9 (*)    All other components within normal limits  COMPREHENSIVE METABOLIC PANEL - Abnormal; Notable for the following:    Sodium 146 (*)    Glucose, Bld 242 (*)    BUN 40 (*)    Albumin 2.8 (*)    AST 43 (*)    Total Bilirubin 0.2 (*)    GFR calc non Af Amer 46 (*)    GFR calc Af Amer 53 (*)    All other components within normal  limits  URINALYSIS, ROUTINE W REFLEX MICROSCOPIC - Abnormal; Notable for the following:    APPearance CLOUDY (*)    Glucose, UA 250 (*)    Protein, ur 30 (*)    All other components within normal limits  POCT I-STAT TROPONIN I - Abnormal; Notable for the following:    Troponin i, poc 0.16 (*)    All other components within normal limits  URINE MICROSCOPIC-ADD ON - Abnormal; Notable for the following:    Bacteria, UA FEW (*)    Casts HYALINE CASTS (*)    All other components within normal limits  DIFFERENTIAL  CK  CARDIAC PANEL(CRET KIN+CKTOT+MB+TROPI)  I-STAT TROPONIN I  POCT CBG MONITORING  PRO B NATRIURETIC PEPTIDE  HEMOGLOBIN A1C  CARDIAC PANEL(CRET KIN+CKTOT+MB+TROPI)  CARDIAC PANEL(CRET KIN+CKTOT+MB+TROPI)  BASIC METABOLIC PANEL  CBC   Dg Chest 1 View  09/15/2011  *RADIOLOGY REPORT*  Clinical Data: Fall, right hip pain.  CHEST - 1 VIEW  Comparison: 11/11 of six  Findings: Prior CABG.  Chronic peribronchial thickening and interstitial prominence, likely chronic bronchitic changes.  No confluent opacities or effusions.  No acute bony abnormality.  IMPRESSION: Prior CABG.  Chronic bronchitic changes.  Original Report Authenticated By: Cyndie Chime, M.D.   Dg Hip Complete Right  09/15/2011  *RADIOLOGY REPORT*  Clinical Data: Fall, hip pain.  RIGHT HIP - COMPLETE 2+ VIEW  Comparison: None  Findings: There is a comminuted right intertrochanteric femoral fracture.  Mild varus angulation and displacement fracture fragments.  IMPRESSION: Comminuted right intertrochanteric fracture with varus angulation.  Original Report Authenticated By: Cyndie Chime, M.D.   Ct Head Wo Contrast  09/15/2011  *RADIOLOGY REPORT*  Clinical Data: Fall, dementia  CT HEAD WITHOUT CONTRAST  Technique:  Contiguous axial images were obtained from the base of the skull through the vertex without contrast.  Comparison: 06/11/2009  Findings: Somewhat limited because of oblique scan acquisition. Diffuse brain  atrophy noted with associated periventricular white matter microvascular ischemic changes and ventricular enlargement. No acute intracranial hemorrhage, definite acute infarction, focal edema, mass lesion, midline shift, herniation, hydrocephalus, or extra-axial fluid collection.  No definite cerebellar abnormality. Atherosclerosis of the intracranial vessels.  Mastoids and sinuses appear clear.  IMPRESSION: Stable atrophy, chronic microvascular ischemic changes and ventricular enlargement.  No definite acute intracranial process by CT.  Original Report Authenticated By: Judie Petit. Ruel Favors, M.D.     1. Closed right hip fracture       MDM  12:40 PM Patient assessed. Obvious shortening and rotation. She apparently was c/o pain during transport and was given 50 mcg of Fentanyl by EMS; she continued to c/o pain and was given an additional  50 mcg and was then noted to have decreased responsiveness. She was given 2mg  IV Narcan and responded afterward. She is currently awake, although she is nonverbal (per EMS, this is pt's baseline). Daughter is not at bedside to give additional history. We'll reassess.  1:12 PM RNs at [facility] drawing blood. Discussed with the patient's daughter. She states that the patient fell early Friday morning, landing on the wood floor. She did not hit her head. States that the patient has been in bed for most of the weekend; she did not complain of pain until this morning when her daughter was bathing her. Daughter does note that she has seemed more confused than her baseline dementia over the past 24 hours. Her speech is more garbled than usual.  2:35 PM Troponin is elevated to 0.16. Some inverted T waves noted as compared with previous EKG in 2006. Will follow with serial enzymes. X-ray does show a comminuted fracture of the hip. Awaiting further blood work; plan to call medicine for admission once all labs have returned.  3:04 PM Talked with Dr. Victorino Dike with ortho. He is aware, will  be by to see pt later this afternoon. Plan to operate tomorrow.  5:27 PM Patient's labs have finally returned. UA does not show infx; CXR appears nl; head CT unremarkable for acute pathology. Consult placed to medicine for admit.  5:39 PM Discussed with Dr. Rito Ehrlich with [facility]. Will admit the patient to team 5.  Grant Fontana, [place] 09/15/11 2125

## 2011-09-15 NOTE — Plan of Care (Signed)
Problem: Consults Goal: Hip/Femur Fracture Patient Education See Patient Education Module for education specifics. Outcome: Progressing Daughter present, explained procedure and what to expect tomorrow.  Verbalizes understanding

## 2011-09-15 NOTE — ED Notes (Signed)
Bed:[facility]<BR> Expected date:09/15/11<BR> Expected time:12:19 PM<BR> Means of arrival:Ambulance<BR> Comments:<BR> EMS 40 GC = fall/pain

## 2011-09-15 NOTE — ED Notes (Signed)
MD at bedside. 

## 2011-09-16 ENCOUNTER — Inpatient Hospital Stay (HOSPITAL_COMMUNITY): Payer: Medicare Other

## 2011-09-16 ENCOUNTER — Encounter (HOSPITAL_COMMUNITY): Payer: Self-pay | Admitting: Anesthesiology

## 2011-09-16 ENCOUNTER — Inpatient Hospital Stay (HOSPITAL_COMMUNITY): Payer: Medicare Other | Admitting: Anesthesiology

## 2011-09-16 ENCOUNTER — Encounter (HOSPITAL_COMMUNITY)
Admission: EM | Disposition: A | Discharge status: Skilled Nursing Facility | Payer: Self-pay | Source: Home / Self Care | Attending: Internal Medicine

## 2011-09-16 HISTORY — PX: FEMUR IM NAIL: SHX1597

## 2011-09-16 LAB — CARDIAC PANEL(CRET KIN+CKTOT+MB+TROPI): Relative Index: 2.8 — ABNORMAL HIGH (ref 0.0–2.5)

## 2011-09-16 LAB — GLUCOSE, CAPILLARY
Glucose-Capillary: 153 mg/dL — ABNORMAL HIGH (ref 70–99)
Glucose-Capillary: 184 mg/dL — ABNORMAL HIGH (ref 70–99)
Glucose-Capillary: 184 mg/dL — ABNORMAL HIGH (ref 70–99)
Glucose-Capillary: 200 mg/dL — ABNORMAL HIGH (ref 70–99)
Glucose-Capillary: 212 mg/dL — ABNORMAL HIGH (ref 70–99)

## 2011-09-16 LAB — CBC
HCT: 29.5 % — ABNORMAL LOW (ref 36.0–46.0)
MCV: 89.1 fL (ref 78.0–100.0)
RBC: 3.31 MIL/uL — ABNORMAL LOW (ref 3.87–5.11)
WBC: 6.7 10*3/uL (ref 4.0–10.5)

## 2011-09-16 LAB — BASIC METABOLIC PANEL
CO2: 22 mEq/L (ref 19–32)
Chloride: 117 mEq/L — ABNORMAL HIGH (ref 96–112)
Sodium: 146 mEq/L — ABNORMAL HIGH (ref 135–145)

## 2011-09-16 LAB — ABO/RH: ABO/RH(D): O POS

## 2011-09-16 LAB — PRO B NATRIURETIC PEPTIDE: Pro B Natriuretic peptide (BNP): 3277 pg/mL — ABNORMAL HIGH (ref 0–450)

## 2011-09-16 LAB — HEMOGLOBIN A1C: Mean Plasma Glucose: 200 mg/dL — ABNORMAL HIGH (ref <117)

## 2011-09-16 LAB — MRSA PCR SCREENING: MRSA by PCR: NEGATIVE

## 2011-09-16 SURGERY — INSERTION, INTRAMEDULLARY ROD, FEMUR
Anesthesia: General | Laterality: Right | Wound class: Clean

## 2011-09-16 MED ORDER — DOCUSATE SODIUM 100 MG PO CAPS
100.0000 mg | ORAL_CAPSULE | Freq: Two times a day (BID) | ORAL | Status: DC
  Administered 2011-09-16 – 2011-09-22 (×11): 100 mg via ORAL
  Filled 2011-09-16 (×14): qty 1

## 2011-09-16 MED ORDER — LACTATED RINGERS IV SOLN: INTRAVENOUS | Status: DC

## 2011-09-16 MED ORDER — HYDRALAZINE HCL 20 MG/ML IJ SOLN
5.0000 mg | INTRAMUSCULAR | Status: DC
  Filled 2011-09-16 (×98): qty 0.25

## 2011-09-16 MED ORDER — METOPROLOL TARTRATE 25 MG PO TABS: 25.0000 mg | ORAL_TABLET | Freq: Two times a day (BID) | ORAL | Status: DC

## 2011-09-16 MED ORDER — LACTATED RINGERS IV SOLN
INTRAVENOUS | Status: DC | PRN
  Administered 2011-09-16: 14:00:00 via INTRAVENOUS

## 2011-09-16 MED ORDER — METOPROLOL TARTRATE 25 MG PO TABS
25.0000 mg | ORAL_TABLET | Freq: Two times a day (BID) | ORAL | Status: DC
  Administered 2011-09-16 – 2011-09-21 (×11): 25 mg via ORAL
  Filled 2011-09-16 (×13): qty 1

## 2011-09-16 MED ORDER — PROPOFOL 10 MG/ML IV BOLUS
INTRAVENOUS | Status: DC | PRN
  Administered 2011-09-16: 100 mg via INTRAVENOUS

## 2011-09-16 MED ORDER — METOPROLOL TARTRATE 25 MG PO TABS
25.0000 mg | ORAL_TABLET | Freq: Two times a day (BID) | ORAL | Status: DC
  Filled 2011-09-16 (×2): qty 1

## 2011-09-16 MED ORDER — CEFAZOLIN SODIUM 1-5 GM-% IV SOLN
1.0000 g | INTRAVENOUS | Status: DC
  Filled 2011-09-16: qty 50

## 2011-09-16 MED ORDER — FENTANYL CITRATE 0.05 MG/ML IJ SOLN
INTRAMUSCULAR | Status: AC
  Filled 2011-09-16: qty 2

## 2011-09-16 MED ORDER — PHENYLEPHRINE HCL 10 MG/ML IJ SOLN
10.0000 mg | INTRAVENOUS | Status: DC | PRN
  Administered 2011-09-16: 5 ug/min via INTRAVENOUS

## 2011-09-16 MED ORDER — PROMETHAZINE HCL 25 MG/ML IJ SOLN: 6.2500 mg | INTRAMUSCULAR | Status: DC | PRN

## 2011-09-16 MED ORDER — SODIUM CHLORIDE 0.9 % IV SOLN
INTRAVENOUS | Status: DC
  Administered 2011-09-16: 11:00:00 via INTRAVENOUS

## 2011-09-16 MED ORDER — HYDRALAZINE HCL 20 MG/ML IJ SOLN
5.0000 mg | Freq: Once | INTRAMUSCULAR | Status: AC
  Administered 2011-09-16: 5 mg via INTRAVENOUS

## 2011-09-16 MED ORDER — CEFAZOLIN SODIUM 1-5 GM-% IV SOLN
INTRAVENOUS | Status: DC | PRN
  Administered 2011-09-16: 1 g via INTRAVENOUS

## 2011-09-16 MED ORDER — FENTANYL CITRATE 0.05 MG/ML IJ SOLN
25.0000 ug | INTRAMUSCULAR | Status: DC | PRN
  Administered 2011-09-16 (×4): 12.5 ug via INTRAVENOUS
  Filled 2011-09-16: qty 2

## 2011-09-16 MED ORDER — CEFAZOLIN SODIUM 1-5 GM-% IV SOLN
INTRAVENOUS | Status: AC
  Filled 2011-09-16: qty 50

## 2011-09-16 MED ORDER — SODIUM CHLORIDE 0.9 % IV SOLN
INTRAVENOUS | Status: DC
  Administered 2011-09-16: 23:00:00 via INTRAVENOUS

## 2011-09-16 MED ORDER — FENTANYL CITRATE 0.05 MG/ML IJ SOLN
INTRAMUSCULAR | Status: DC | PRN
  Administered 2011-09-16 (×4): 25 ug via INTRAVENOUS

## 2011-09-16 MED ORDER — ENOXAPARIN SODIUM 40 MG/0.4ML ~~LOC~~ SOLN
40.0000 mg | SUBCUTANEOUS | Status: DC
  Administered 2011-09-17 – 2011-09-21 (×5): 40 mg via SUBCUTANEOUS
  Filled 2011-09-16 (×6): qty 0.4

## 2011-09-16 MED ORDER — LIDOCAINE HCL (CARDIAC) 20 MG/ML IV SOLN
INTRAVENOUS | Status: DC | PRN
  Administered 2011-09-16: 80 mg via INTRAVENOUS

## 2011-09-16 MED ORDER — HYDRALAZINE HCL 20 MG/ML IJ SOLN
INTRAMUSCULAR | Status: AC
  Filled 2011-09-16: qty 1

## 2011-09-16 MED ORDER — HYDROCODONE-ACETAMINOPHEN 5-325 MG PO TABS
1.0000 | ORAL_TABLET | Freq: Four times a day (QID) | ORAL | Status: DC | PRN
  Administered 2011-09-17 (×2): 1 via ORAL
  Filled 2011-09-16 (×3): qty 1

## 2011-09-16 MED ORDER — SUCCINYLCHOLINE CHLORIDE 20 MG/ML IJ SOLN
INTRAMUSCULAR | Status: DC | PRN
  Administered 2011-09-16: 80 mg via INTRAVENOUS

## 2011-09-16 SURGICAL SUPPLY — 44 items
BAG SPEC THK2 15X12 ZIP CLS (MISCELLANEOUS)
BAG ZIPLOCK 12X15 (MISCELLANEOUS) ×1 IMPLANT
BANDAGE ELASTIC 6 VELCRO ST LF (GAUZE/BANDAGES/DRESSINGS) ×1 IMPLANT
BANDAGE GAUZE ELAST BULKY 4 IN (GAUZE/BANDAGES/DRESSINGS) ×1 IMPLANT
BIT DRILL 4.3MMS DISTAL GRDTED (BIT) IMPLANT
CHLORAPREP W/TINT 26ML (MISCELLANEOUS) ×2 IMPLANT
CLOTH BEACON ORANGE TIMEOUT ST (SAFETY) ×2 IMPLANT
CORTICAL BONE SCR 5.0MM X 46MM (Screw) ×2 IMPLANT
DRAPE STERI IOBAN 125X83 (DRAPES) ×1 IMPLANT
DRILL 4.3MMS DISTAL GRADUATED (BIT) ×2
DRSG EMULSION OIL 3X16 NADH (GAUZE/BANDAGES/DRESSINGS) ×1 IMPLANT
DRSG MEPILEX BORDER 4X4 (GAUZE/BANDAGES/DRESSINGS) ×3 IMPLANT
DRSG MEPILEX BORDER 4X8 (GAUZE/BANDAGES/DRESSINGS) ×1 IMPLANT
DRSG PAD ABDOMINAL 8X10 ST (GAUZE/BANDAGES/DRESSINGS) ×1 IMPLANT
ELECT REM PT RETURN 9FT ADLT (ELECTROSURGICAL) ×2
ELECTRODE REM PT RTRN 9FT ADLT (ELECTROSURGICAL) ×1 IMPLANT
GLOVE BIO SURGEON STRL SZ7.5 (GLOVE) ×1 IMPLANT
GLOVE BIOGEL PI IND STRL 8.5 (GLOVE) ×1 IMPLANT
GLOVE BIOGEL PI INDICATOR 8.5 (GLOVE)
GLOVE SURG ORTHO 7.0 STRL STRW (GLOVE) ×1 IMPLANT
GLOVE SURG ORTHO 9.0 STRL STRW (GLOVE) ×1 IMPLANT
GOWN PREVENTION PLUS LG XLONG (DISPOSABLE) ×2 IMPLANT
GUIDEPIN 3.2X17.5 THRD DISP (PIN) ×1 IMPLANT
GUIDEWIRE BALL NOSE 100CM (WIRE) ×1 IMPLANT
HFN RH 130 DEG 11MM X 360MM (Orthopedic Implant) ×1 IMPLANT
HIP FRA NAIL LAG SCREW 10.5X90 (Orthopedic Implant) ×2 IMPLANT
KIT BASIN OR (CUSTOM PROCEDURE TRAY) ×2 IMPLANT
MANIFOLD NEPTUNE II (INSTRUMENTS) ×2 IMPLANT
NS IRRIG 1000ML POUR BTL (IV SOLUTION) ×2 IMPLANT
PACK GENERAL/GYN (CUSTOM PROCEDURE TRAY) ×2 IMPLANT
PAD CAST 4YDX4 CTTN HI CHSV (CAST SUPPLIES) ×1 IMPLANT
PADDING CAST COTTON 4X4 STRL (CAST SUPPLIES)
POSITIONER SURGICAL ARM (MISCELLANEOUS) ×1 IMPLANT
SCREW BONE CORTICAL 5.0X42 (Screw) ×1 IMPLANT
SCREW CORTICL BON 5.0MM X 46MM (Screw) IMPLANT
SCREW LAG HIP FRA NAIL 10.5X90 (Orthopedic Implant) IMPLANT
SCREWDRIVER HEX TIP 3.5MM (MISCELLANEOUS) ×1 IMPLANT
STAPLER VISISTAT 35W (STAPLE) ×2 IMPLANT
SUT VIC AB 0 CT1 27 (SUTURE) ×2
SUT VIC AB 0 CT1 27XBRD ANTBC (SUTURE) ×3 IMPLANT
SUT VIC AB 2-0 CT1 27 (SUTURE) ×2
SUT VIC AB 2-0 CT1 27XBRD (SUTURE) ×2 IMPLANT
TOWEL OR 17X26 10 PK STRL BLUE (TOWEL DISPOSABLE) ×4 IMPLANT
WATER STERILE IRR 1500ML POUR (IV SOLUTION) ×1 IMPLANT

## 2011-09-16 NOTE — Op Note (Signed)
Shelby Burns, Shelby Burns          ACCOUNT NO.:  [id number]  MEDICAL RECORD NO.:  [id number]  LOCATION:  1333                         FACILITY:  [facility]  PHYSICIAN:  Toni Arthurs, MD        DATE OF BIRTH:  07-08-1917  DATE OF PROCEDURE:  09/16/2011 DATE OF DISCHARGE:                              OPERATIVE REPORT   PREOPERATIVE DIAGNOSIS:  Right hip intertrochanteric fracture.  POSTOPERATIVE DIAGNOSIS:  Right hip intertrochanteric fracture.  PROCEDURE: 1. Intramedullary nailing, right hip intertrochanteric fracture. 2. Intraoperative interpretation of fluoroscopic imaging.  SURGEON:  Toni Arthurs, MD  ANESTHESIA:  General.  IV FLUIDS:  See anesthesia record.  ESTIMATED BLOOD LOSS:  50 cc.  COMPLICATIONS:  None apparent.  DISPOSITION:  Extubated, awake, and stable to Recovery.  INDICATIONS FOR PROCEDURE:  The patient is a 77 year old woman who fell at home on Friday.  She was brought to the hospital Monday where x-rays revealed an intertrochanteric comminuted fracture of the right hip.  She presents now for operative treatment of this injury.  She has dementia, so her daughter provides consent today.  The patient's daughter understands risks and benefits of the alternative treatment options and elected surgical treatment.  Specifically, the patient's daughter understands risks of bleeding, infection, nerve damage, blood clots, need for additional surgery, and death.  PROCEDURE IN DETAIL:  After preoperative consent was obtained, the correct operative site was identified and the patient was brought to the operating room supine on a stretcher.  General anesthesia was induced. Preoperative antibiotics were administered.  Surgical time-out was taken.  The patient was then moved onto the fracture table.  The left lower extremity was positioned in a well-leg holder.  The right lower extremity was positioned in a traction boot.  Abduction and external rotation were then combined  with traction followed by internal rotation and adduction.  AP and lateral fluoroscopic images were obtained showing appropriate reduction of the fracture site.  The right lower extremity was then prepped and draped in standard sterile fashion.  A longitudinal incision was made about three fingerbreadths proximal from the tip of the greater trochanter.  Blunt dissection was carried down through the skin and subcutaneous tissue to the level of the greater trochanter.  A guide pin was then inserted at the apex of the greater trochanter just medial to the tip.  The guide pin was advanced into the proximal femur. AP and lateral views were obtained verifying correct placement of the guide pin.  It was advanced into the femoral canal.  A starting reamer was then introduced through the protective sleeve and drilled to its maximal depth.  The reamer and guide pin were removed.  A ball-tipped guide pin was then passed down the femoral canal to the level of the superior pole of the patella.  AP and lateral fluoroscopic views confirmed appropriate position of the guide pin.  A 13-mm reamer was then inserted down the femoral canal easily.  An 11 mm x 360 mm nail was selected and inserted over the guide pin.  It was seated appropriately in line with the inferior aspect of the femoral neck.  The lateral guide pin targeting arm was inserted.  A stab incision was made  at the lateral aspect of the thigh.  The sleeve was passed down to the level of the lateral cortex of the femur.  A guide pin was inserted into the femoral canal.  AP and lateral images confirmed appropriate trajectory of the guide pin.  It was inserted until it was adjacent to the subchondral bone of the femoral head.  It was measured.  A 90-mm lag screw was selected.  It was inserted over the guide pin and driven into appropriate position adjacent to the subchondral bone.  AP and lateral views showed appropriate depth of the screw.   Traction was released on the foot.  Final AP and lateral views proximally showed appropriate position and length of the nail as well as the lag screw.  Attention was then turned to the distal aspect of the nail.  The perfect circle technique was used to insert 2 interlocking screws distally through lateral stab incisions.  Final AP and lateral views distally showed appropriate position and length of both screws as well as the nail.  All wounds were irrigated copiously.  2-0 Vicryl simple sutures were used to repair the IT band at the lateral thigh incision.  Proximally, the subcutaneous tissue was approximated with inverted simple sutures of 2-0 Vicryl.  All four incisions were closed at the level of the skin with staples.  Sterile dressings were applied.  The patient was then awakened from anesthesia and transported to the recovery room in stable condition.  FOLLOWUP PLAN:  The patient will be weightbearing as tolerated on her right lower extremity.  She will have Physical Therapy, Occupational Therapy, and Social Work consults.  She is likely to need short-term placement in a skilled nursing facility for rehab.  She may need observation overnight in Intensive Care for cardiovascular stability.     Toni Arthurs, MD     JH/MEDQ  D:  09/16/2011  T:  09/16/2011  Job:  [id number]

## 2011-09-16 NOTE — Transfer of Care (Signed)
Immediate Anesthesia Transfer of Care Note  Patient: Shelby Burns  Procedure(s) Performed:  INTRAMEDULLARY (IM) NAIL FEMORAL  Patient Location: PACU  Anesthesia Type: General  Level of Consciousness: awake and alert   Airway & Oxygen Therapy: Patient Spontanous Breathing and Patient connected to face mask oxygen  Post-op Assessment: Report given to PACU RN and Post -op Vital signs reviewed and stable  Post vital signs: Reviewed and stable  Complications: No apparent anesthesia complications

## 2011-09-16 NOTE — ED Provider Notes (Signed)
Medical screening examination/treatment/procedure(s) were performed by non-physician practitioner and as supervising physician I was immediately available for consultation/collaboration.   Nat Christen, MD 09/16/11 [phone]

## 2011-09-16 NOTE — Brief Op Note (Signed)
09/15/2011 - 09/16/2011  3:32 PM  PATIENT:  Shelby Burns  77 y.o. female  PRE-OPERATIVE DIAGNOSIS:  Right hip intertrochanteric fracture  POST-OPERATIVE DIAGNOSIS:  Same  Procedure(s): 1.  Intramedullary nailing right hip intertroch fracture 2.  fluoro  SURGEON:  Toni Arthurs, MD  ASSISTANT: n/a  ANESTHESIA:   General  EBL:  50 cc   COMPLICATIONS:  None apparent  DISPOSITION:  Extubated, awake and stable to recovery.  DICTATION ID:  [id number]

## 2011-09-16 NOTE — Consult Note (Signed)
Nutrition Consult Note  - Met with pt's daughter and recorded pt's typical food/snack/beverage intake with food preferences. Daughter concerned that once pt on solid food diet during hospital that pt will receive too many carbohydrates. All food preferences entered into [employer] [facility] diet order system and saved for when pt begins diet. Daughter expressed appreciation. Will monitor for diet advancement.   Dietitian # [phone]

## 2011-09-16 NOTE — Interval H&P Note (Signed)
History and Physical Interval Note:  09/16/2011 1:04 PM  Shelby Burns  has presented today for surgery, with the diagnosis of FEMUR FRACTURE  The various methods of treatment have been discussed with the patient and family. After consideration of risks, benefits and other options for treatment, the patient has consented to  Procedure(s): INTRAMEDULLARY (IM) NAIL FEMORAL as a surgical intervention .  The patients' history has been reviewed, patient examined, no change in status, stable for surgery.  I have reviewed the patients' chart and labs.  Questions were answered to the patient's satisfaction.    The risks and benefits of the alternative treatment options have been discussed in detail.  The patient wishes to proceed with surgery and specifically understands risks of bleeding, infection, nerve damage, blood clots, need for additional surgery, amputation and death.   Toni Arthurs

## 2011-09-16 NOTE — Anesthesia Postprocedure Evaluation (Signed)
Anesthesia Post Note  Patient: Shelby Burns  Procedure(s) Performed:  INTRAMEDULLARY (IM) NAIL FEMORAL  Anesthesia type: General  Patient location: PACU  Post pain: Pain level controlled  Post assessment: Post-op Vital signs reviewed  Last Vitals:  Filed Vitals:   09/16/11 1630  BP: 138/92  Pulse: 75  Temp: 37.1 C  Resp: 16    Post vital signs: Reviewed  Level of consciousness: sedated  Complications: No apparent anesthesia complications

## 2011-09-16 NOTE — Progress Notes (Addendum)
[facility]ITAL[facility] progress note    Interval h/o:- 77 year old white female with past oral history of diabetes, hypertension, CAD and advanced Alzheimer's dementia who lives at home with her daughter and comes in after she had a recent fall-had a particularly bad fall 12.14.12, usually ambulates w.o assist, but has been weaker and needing a walker. ED eval=Closed R Intertrochanteric # Poc Cm showed troponin elevation, but rpt CE x 1 was neg Ct head neg for stroke/bleed Ortho eval done by Dr. Victorino Dike who will take her for Sx 12.18   Subjective: Mumbles. Not able to coherently converse.  Nursing reports some agitation.  Pain mod controlled, but screams on turning.  Treatment Team:  Toni Arthurs, MD Objective: Vital signs in last 24 hours: Temp:  [97.3 F (36.3 C)-98.4 F (36.9 C)] 97.5 F (36.4 C) (12/18 0540) Pulse Rate:  [52-117] 52  (12/18 0540) Resp:  [16-24] 18  (12/18 0540) BP: (139-231)/(52-83) 159/52 mmHg (12/18 0540) SpO2:  [85 %-100 %] 97 % (12/18 0540) Weight:  [52.8 kg (116 lb 6.5 oz)] 116 lb 6.5 oz (52.8 kg) (12/17 2054) Weight change:   Intake/Output Summary (Last 24 hours) at 09/16/11 0952 Last data filed at 09/16/11 0700  Gross per 24 hour  Intake  412.5 ml  Output    625 ml  Net -212.5 ml    BP 159/52  Pulse 52  Temp(Src) 97.5 F (36.4 C) (Axillary)  Resp 18  Ht 5' (1.524 m)  Wt 52.8 kg (116 lb 6.5 oz)  BMI 22.73 kg/m2  SpO2 97% General appearance: cachectic, mild distress and uncooperative Ears: Not visualized Throat: lips, mucosa, and tongue normal; teeth and gums normal Lungs: rales bibasilar Heart: regular rate and rhythm, S1, S2 normal, no murmur, click, rub or gallop Abdomen: soft, non-tender; bowel sounds normal; no masses,  no organomegaly Extremities: extremities normal, atraumatic, no cyanosis or edema Pulses: 2+ and symmetric Neurologic: Grossly normal-but disorented otherwise.  Has shortening of the R Leg which is painful to  move  Lab Results:  Basename 09/16/11 0100 09/15/11 1332  NA 146* 146*  K 3.5 3.7  CL 117* 112  CO2 22 24  GLUCOSE 166* 242*  BUN 38* 40*  CREATININE 0.88 1.03  CALCIUM 8.6 9.3  MG -- --  PHOS -- --    Basename 09/15/11 1332  AST 43*  ALT 21  ALKPHOS 51  BILITOT 0.2*  PROT 6.2  ALBUMIN 2.8*   No results found for this basename: LIPASE:2,AMYLASE:2 in the last 72 hours  Basename 09/16/11 0100 09/15/11 1332  WBC 6.7 7.7  NEUTROABS -- 5.6  HGB 9.8* 10.5*  HCT 29.5* 31.9*  MCV 89.1 88.9  PLT 183 208    Basename 09/16/11 0100 09/15/11 1720 09/15/11 1339  CKTOTAL 124 162 158  CKMB 3.5 2.6 --  CKMBINDEX -- -- --  TROPONINI <0.30 <0.30 --    Micro Results: Recent Results (from the past 240 hour(s))  MRSA PCR SCREENING     Status: Normal   Collection Time   09/15/11 11:30 PM      Component Value Range Status Comment   MRSA by PCR NEGATIVE  NEGATIVE  Final     Medications: I have reviewed the patient's current medications. Scheduled Meds:   . sodium chloride   Intravenous Once  . ceFAZolin (ANCEF) IV  1 g Intravenous 60 min Pre-Op  . insulin aspart  0-9 Units Subcutaneous Q4H  . LORazepam  0.5 mg Intravenous Once  . memantine  10 mg  Oral BID  . metoprolol  5 mg Intravenous Q6H  . naloxone      . ondansetron      . pantoprazole (PROTONIX) IV  40 mg Intravenous Q1200  . vitamin A & D      . DISCONTD: LORazepam  0.5 mg Oral Once   Continuous Infusions:   . dextrose 5 % and 0.9% NaCl 75 mL/hr at 09/16/11 0230   PRN Meds:.acetaminophen, acetaminophen, albuterol, hydrALAZINE, LORazepam, morphine, ondansetron (ZOFRAN) IV, ondansetron, DISCONTD: morphine   Assessment/Plan:  Patient Active Hospital Problem List: Closed intertrochanteric fracture of right hip (09/15/2011)   Assessment:Per Dr. Victorino Dike.  Can likely proceed with surgery. Benefits far outweigh risks and both have been discussed with family. Typed and screened  CAD (coronary artery disease) of  artery bypass graft (09/15/2011)   Assessment: H/o CABG-no notes available in e-chart. Had CABG in '84, then revision with 4 stents as well.  Would not likely change outcome-will try to get a copy of notes from Dr. Patty Sermons re: echo etc.  Acute hypernatremia (09/15/2011)   Assessment:  Likely 2/2 to hypernatremic vol depletion.  Will continue IVf, but cut back rate to 50 cc /hr Rpt bmet am   Unsteady gait (09/15/2011)   Assessment: dementia vs recent falls  HTN (hypertension), benign (09/15/2011)   Assessment: Was significantly hypertensive overnight-continue Prn meds for now as hydralazibne only Started back scheduled Metoprolol 25 bid Held home Diltiazem and also home Enalapril (could cause some renal insuff)  DM type 2 (diabetes mellitus, type 2) (09/15/2011)   Assessment: d/c the D5-ns for now-blood sugars acceptable  Elevated troponin-Unlikely ACS-rpt was neg.  Asked daughter to sign release to get [facility] notes  Dementia, vascular with delirium (09/15/2011)   Assessment: likely stage 5-7    I have fully updated family as to the course of their family members care and have answered all questions that they had        LOS: 1 day   [facility] 09/16/2011, 9:52 AM

## 2011-09-16 NOTE — Anesthesia Preprocedure Evaluation (Addendum)
Anesthesia Evaluation  Patient identified by MRN, date of birth, ID band Patient confused  General Assessment Comment:Medical history provided by daughter  Reviewed: Unable to perform ROS - Chart review only  History of Anesthesia Complications (+) AWARENESS UNDER ANESTHESIANegative for: history of anesthetic complications  Airway Mallampati: III      Dental  (+) Teeth Intact, Caps and Dental Advisory Given   Pulmonary shortness of breath and at rest,  clear to auscultation  Pulmonary exam normal + decreased breath sounds + stridor     Cardiovascular hypertension (Poor control preop), Pt. on medications and Pt. on home beta blockers + angina + CAD, + Cardiac Stents (Stents x 4 post CABG) and + CABG (CABG W0,9811) Regular Normal+ Systolic murmurs    Neuro/Psych PSYCHIATRIC DISORDERS History of Dementia    GI/Hepatic Neg liver ROS, GERD-  Medicated,  Endo/Other  Diabetes mellitus-, Well Controlled, Type 2, Insulin Dependent  Renal/GU negative Renal ROS  Genitourinary negative   Musculoskeletal  (+) Arthritis -,   Abdominal Normal abdominal exam  (+)   Peds  Hematology  (+) HIV, Prior transfusions in past secondary to chronic anemia   Anesthesia Other Findings   Reproductive/Obstetrics                        Anesthesia Physical Anesthesia Plan  ASA: III  Anesthesia Plan: General   Post-op Pain Management:    Induction: Intravenous  Airway Management Planned: Oral ETT  Additional Equipment:   Intra-op Plan:   Post-operative Plan: Extubation in OR  Informed Consent: I have reviewed the patients History and Physical, chart, labs and discussed the procedure including the risks, benefits and alternatives for the proposed anesthesia with the patient or authorized representative who has indicated his/her understanding and acceptance.   Dental advisory given  Plan Discussed with:  CRNA  Anesthesia Plan Comments: (Patient a high cardiovascular risk for anesthesia/surgical intervention)        Anesthesia Quick Evaluation

## 2011-09-17 DIAGNOSIS — D649 Anemia, unspecified: Secondary | ICD-10-CM | POA: Diagnosis present

## 2011-09-17 DIAGNOSIS — N179 Acute kidney failure, unspecified: Secondary | ICD-10-CM | POA: Diagnosis present

## 2011-09-17 LAB — GLUCOSE, CAPILLARY: Glucose-Capillary: 180 mg/dL — ABNORMAL HIGH (ref 70–99)

## 2011-09-17 LAB — CBC
HCT: 29.6 % — ABNORMAL LOW (ref 36.0–46.0)
Hemoglobin: 9.8 g/dL — ABNORMAL LOW (ref 12.0–15.0)
RBC: 3.25 MIL/uL — ABNORMAL LOW (ref 3.87–5.11)

## 2011-09-17 LAB — BASIC METABOLIC PANEL
CO2: 22 mEq/L (ref 19–32)
Calcium: 8.5 mg/dL (ref 8.4–10.5)
Creatinine, Ser: 0.82 mg/dL (ref 0.50–1.10)
Glucose, Bld: 180 mg/dL — ABNORMAL HIGH (ref 70–99)
Potassium: 3.7 mEq/L (ref 3.5–5.1)

## 2011-09-17 LAB — PROTIME-INR
INR: 1.15 (ref 0.00–1.49)
Prothrombin Time: 14.9 seconds (ref 11.6–15.2)

## 2011-09-17 MED ORDER — INSULIN ASPART 100 UNIT/ML ~~LOC~~ SOLN: 0.0000 [IU] | Freq: Every day | SUBCUTANEOUS | Status: DC

## 2011-09-17 MED ORDER — VITAMINS A & D EX OINT
TOPICAL_OINTMENT | CUTANEOUS | Status: AC
  Filled 2011-09-17: qty 5

## 2011-09-17 MED ORDER — INSULIN ASPART 100 UNIT/ML ~~LOC~~ SOLN
0.0000 [IU] | Freq: Three times a day (TID) | SUBCUTANEOUS | Status: DC
  Administered 2011-09-17: 2 [IU] via SUBCUTANEOUS
  Administered 2011-09-18: 1 [IU] via SUBCUTANEOUS
  Filled 2011-09-17: qty 3

## 2011-09-17 MED ORDER — SODIUM CHLORIDE 0.45 % IV SOLN
INTRAVENOUS | Status: DC
  Administered 2011-09-17 – 2011-09-18 (×2): via INTRAVENOUS

## 2011-09-17 MED ORDER — INSULIN GLARGINE 100 UNIT/ML ~~LOC~~ SOLN
10.0000 [IU] | Freq: Every day | SUBCUTANEOUS | Status: DC
  Administered 2011-09-17 – 2011-09-21 (×5): 10 [IU] via SUBCUTANEOUS
  Filled 2011-09-17: qty 3

## 2011-09-17 NOTE — Progress Notes (Signed)
Physical Therapy Evaluation Patient Details Name: Shelby Burns MRN: [id number] DOB: 02-10-1917 Today's Date: 09/17/2011 Time: 905-926 Charge: EVII  Problem List:  Patient Active Problem List  Diagnoses  . CAD (coronary artery disease) of artery bypass graft  . Acute hypernatremia  . Unsteady gait  . HTN (hypertension), benign  . DM type 2 (diabetes mellitus, type 2)  . Dementia, vascular with delirium  . Closed intertrochanteric fracture of right hip    Past Medical History:  Past Medical History  Diagnosis Date  . Angina   . Anemia   . Diabetes mellitus   . Complication of anesthesia     ultra sensitive   Past Surgical History:  Past Surgical History  Procedure Date  . Coronary artery bypass graft     1984  . Coronary stents     4 all together  . Colon surgery approx. 1992    colon tumor benign  . Skin cancer excision     face - squamous cell  . Nasal reconstruction     rebuilt due to squamous cell ca    PT Assessment/Plan/Recommendation PT Assessment Clinical Impression Statement: Pt s/p R IM nail and with hx of cognitive impairment.  Pt presents with increased assist for mobility 2* increased R hip pain and limited eval 2* high BP and elevated HR (most likely 2* to pain) with supine to sit transfer.  Pt HR increased to 135 bpm upon sitting and decreased back down into 80's with supine position.  Pt would benefit from acute PT services in order to improve mobility by increased strength, decreasing pain, and reinforcing safety with RW.  Recommend SNF upon D/C. PT Recommendation/Assessment: Patient will need skilled PT in the acute care venue PT Problem List: Decreased strength;Decreased activity tolerance;Decreased mobility;Decreased balance;Decreased safety awareness;Decreased knowledge of use of DME;Cardiopulmonary status limiting activity;Pain PT Therapy Diagnosis : Difficulty walking;Acute pain PT Plan PT Frequency: Min 3X/week PT Treatment/Interventions:  DME instruction;Gait training;Functional mobility training;Therapeutic activities;Therapeutic exercise;Balance training;Neuromuscular re-education;Patient/family education;Wheelchair mobility training PT Recommendation Recommendations for Other Services:  (Seen with OT) Follow Up Recommendations: Skilled nursing facility Equipment Recommended: Defer to next venue PT Goals  Acute Rehab PT Goals PT Goal Formulation: Patient unable to participate in goal setting Time For Goal Achievement: 2 weeks Pt will go Supine/Side to Sit: with min assist PT Goal: Supine/Side to Sit - Progress: Not met Pt will go Sit to Stand: with min assist PT Goal: Sit to Stand - Progress: Not met Pt will Transfer Bed to Chair/Chair to Bed: with min assist PT Transfer Goal: Bed to Chair/Chair to Bed - Progress: Not met Pt will Ambulate: 1 - 15 feet;with rolling walker;with mod assist PT Goal: Ambulate - Progress: Not met Pt will Perform Home Exercise Program: with supervision, verbal cues required/provided PT Goal: Perform Home Exercise Program - Progress: Not met  PT Evaluation Precautions/Restrictions  Precautions Precautions: Fall Required Braces or Orthoses: No Restrictions Weight Bearing Restrictions: Yes RLE Weight Bearing: Weight bearing as tolerated Prior Functioning  Home Living Lives With: Daughter Receives Help From: Personal care attendant;Other (Comment) (10 am to 7 pm) Type of Home: House Home Layout: One level Home Access: Level entry Bathroom Shower/Tub: Health visitor: Standard Home Adaptive Equipment: Walker - rolling;Shower chair without back Additional Comments: Pt does not use walker, forgets or states she does not need it. Prior Function Level of Independence: Independent with basic ADLs;Needs assistance with gait Comments: Personal care attendent comes 10 am-7pm and supervises pt.  Pt doesn't use  RW because she doesn't like it and also forgets to use it per personal  care attendent.  Personal care attendent also reports she holds her waist during ambulation for pt support and safety. Cognition Cognition Arousal/Alertness: Awake/alert Overall Cognitive Status: History of cognitive impairments History of Cognitive Impairment: Appears at baseline functioning Orientation Level: Oriented to person Sensation/Coordination   Extremity Assessment RUE Assessment RUE Assessment: Within Functional Limits (arthritic changes in hand) LUE Assessment LUE Assessment: Within Functional Limits (arthritic changes in hand) RLE Assessment RLE Assessment: Exceptions to [facility] RLE Strength RLE Overall Strength Comments: Pt performed no active movements with observation LLE Strength LLE Overall Strength Comments: some movement against gravity upon observation.  unable to test 2* cognition Mobility (including Balance) Bed Mobility Bed Mobility: Yes Supine to Sit: 1: +2 Total assist;Patient percentage (comment);HOB elevated (Comment degrees) Supine to Sit Details (indicate cue type and reason): pt=<10%, assist for bilateral LEs and trunk Sit to Supine - Left: HOB elevated (comment degrees);Patient percentage (comment);1: +2 Total assist Sit to Supine - Left Details (indicate cue type and reason): pt=<10%, assist for bilateral LEs and trunk Scooting to HOB: 1: +2 Total assist;Patient percentage (comment) Scooting to [facility] Details (indicate cue type and reason): pt=0% Transfers Transfers: No (Pt HR increased to 135 with sitting.)  Balance Balance Assessed: Yes Static Sitting Balance Static Sitting - Balance Support: Left upper extremity supported;Feet unsupported Static Sitting - Level of Assistance: 3: Mod assist Static Sitting - Comment/# of Minutes: pt mod assist 2* leaning to Left side to unweight right hip 2* pain.  only able to tolerate about a minute of sitting. Exercise    End of Session PT - End of Session Activity Tolerance: Treatment limited secondary to medical  complications (Comment);Patient limited by pain Patient left: in bed;with call bell in reach;with family/visitor present (with personal care attendent) General Behavior During Session: [facility] for tasks performed Cognition: Impaired, at baseline  Samon Dishner,KATHrine E 09/17/2011, 10:49 AM Pager: [phone]

## 2011-09-17 NOTE — Progress Notes (Signed)
Occupational Therapy Evaluation Patient Details Name: Shelby Burns MRN: [id number] DOB: 1917/07/13 Today's Date: 09/17/2011  Problem List:  Patient Active Problem List  Diagnoses  . CAD (coronary artery disease) of artery bypass graft  . Acute hypernatremia  . Unsteady gait  . HTN (hypertension), benign  . DM type 2 (diabetes mellitus, type 2)  . Dementia, vascular with delirium  . Closed intertrochanteric fracture of right hip    Past Medical History:  Past Medical History  Diagnosis Date  . Angina   . Anemia   . Diabetes mellitus   . Complication of anesthesia     ultra sensitive   Past Surgical History:  Past Surgical History  Procedure Date  . Coronary artery bypass graft     1984  . Coronary stents     4 all together  . Colon surgery approx. 1992    colon tumor benign  . Skin cancer excision     face - squamous cell  . Nasal reconstruction     rebuilt due to squamous cell ca    OT Assessment/Plan/Recommendation OT Assessment Clinical Impression Statement: Pt is a 77 year old woman who sustained a R hip fx in a fall and underwent an ORIF.  At this point pt is total care in ADL and mobility.  Will follow pt acutely.  Recommend SNF upon d/c. OT Recommendation/Assessment: Patient will need skilled OT in the acute care venue OT Problem List: Decreased activity tolerance;Impaired balance (sitting and/or standing);Cardiopulmonary status limiting activity;Pain;Decreased cognition;Decreased strength OT Therapy Diagnosis : Generalized weakness;Cognitive deficits;Acute pain OT Plan OT Frequency: Min 1X/week OT Treatment/Interventions: Self-care/ADL training;Therapeutic activities OT Recommendation Follow Up Recommendations: Skilled nursing facility Equipment Recommended: Defer to next venue Individuals Consulted Consulted and Agree with Results and Recommendations: Family member/caregiver;Patient Family Member Consulted:  (caregiver) OT Goals Acute Rehab OT  Goals OT Goal Formulation: Patient unable to participate in goal setting Time For Goal Achievement: 2 weeks ADL Goals Pt Will Perform Grooming: with set-up;Sitting, edge of bed ADL Goal: Grooming - Progress: Not met Pt Will Perform Upper Body Bathing: with min assist;Sitting, edge of bed ADL Goal: Upper Body Bathing - Progress: Not met Pt Will Perform Upper Body Dressing: with min assist;Sitting, bed ADL Goal: Upper Body Dressing - Progress: Not met Pt Will Transfer to Toilet: with 2+ total assist;3-in-1;Other (comment);Stand pivot transfer (50%) ADL Goal: Toilet Transfer - Progress: Not met Miscellaneous OT Goals Miscellaneous OT Goal #1: Pt will perform supine to sit at EOB with max assist in prep for ADL. OT Goal: Miscellaneous Goal #1 - Progress: Not met  OT Evaluation Precautions/Restrictions  Precautions Precautions: Fall Required Braces or Orthoses: No Restrictions Weight Bearing Restrictions: Yes (WBAT R LE) Prior Functioning Home Living Lives With: Daughter Receives Help From: Personal care attendant;Other (Comment) (10 am to 7 pm) Type of Home: House Home Layout: One level Home Access: Level entry Bathroom Shower/Tub: Health visitor: Standard Home Adaptive Equipment: Walker - rolling;Shower chair without back Additional Comments: Pt does not use walker, forgets or states she does not need it. Prior Function Level of Independence: Independent with basic ADLs;Needs assistance with gait Comments: Personal care attendent comes 10-7 and supervises pt.  Pt doesn't use RW because she doesn't like it per personal care attendent.  Personal care attendent also reports she holds her waist during ambulation for pt support and safety. ADL ADL Eating/Feeding: Performed;Other (comment);Minimal assistance (drink only, with lid) Where Assessed - Eating/Feeding: Bed level Grooming: Performed;+1 Total assistance;Brushing hair;Wash/dry face Where  Assessed - Grooming:  Supine, head of bed up Upper Body Bathing: Simulated;+1 Total assistance Where Assessed - Upper Body Bathing: Supine, head of bed up Lower Body Bathing: Simulated;+1 Total assistance Where Assessed - Lower Body Bathing: Supine, head of bed up Upper Body Dressing: Simulated;+1 Total assistance Where Assessed - Upper Body Dressing: Supine, head of bed up Lower Body Dressing: Simulated;+1 Total assistance Where Assessed - Lower Body Dressing: Supine, head of bed up ADL Comments: Pt distracted by pain. Vision/Perception  Vision - History Baseline Vision: Wears glasses only for reading Cognition Cognition Arousal/Alertness: Awake/alert Overall Cognitive Status: History of cognitive impairments History of Cognitive Impairment: Appears at baseline functioning Orientation Level: Oriented to person Extremity Assessment RUE Assessment RUE Assessment: Within Functional Limits (arthritic changes in hand) LUE Assessment LUE Assessment: Within Functional Limits (arthritic changes in hand) Mobility  Bed Mobility Bed Mobility: Yes (+2 total assist pt 10% at best for supine to and from sit) Transfers Transfers: No (Pt's HR increased to 135 with bed mobility, returned to supi)   End of Session OT - End of Session Activity Tolerance: Patient limited by pain;Treatment limited secondary to medical complications (Comment) (Limited eval due to pain and increased HR with activity, monitored throughout) Patient left: in bed;with call bell in reach;with family/visitor present;Other (comment) (caregiver) General Behavior During Session: [facility] for tasks performed Cognition: Impaired, at baseline   Evern Bio 09/17/2011, 10:10 AM [phone]

## 2011-09-17 NOTE — Progress Notes (Signed)
PATIENT DETAILS Name: Shelby Burns Age: 77 y.o. Sex: female Date of Birth: July 16, 1917 Admit Date: 09/15/2011 WUJ:[id number],NWGNFA, MD  CONSULTS: 1.  Dr. Toni Arthurs, Orthopedics  Interval History: Shelby Burns is a 77 year old female who was brought to the hospital for evaluation of right-sided hip pain after suffering from a fall 3 days prior to admission. Family was concerned because she was becoming weaker and more confused since the fall. Upon initial evaluation emergency department, the patient was found to have a right intertrochanteric comminuted fracture of her hip. She has been seen by the orthopedic surgeons who performed an intramedullary nail fixation of the femur on 09/16/2011.  ROS: Shelby Burns is pleasantly confused. She denies any chest pain or dyspnea. Does not yet have an appetite. Denies nausea or vomiting. Is having some postoperative hip discomfort but reports that her pain is overall controlled with medicine.   Objective: Vital signs in last 24 hours: Temp:  [97.2 F (36.2 C)-98.8 F (37.1 C)] 97.2 F (36.2 C) (12/19 1200) Pulse Rate:  [62-107] 73  (12/19 1200) Resp:  [12-24] 16  (12/19 1200) BP: (103-216)/(35-103) 156/54 mmHg (12/19 1200) SpO2:  [93 %-100 %] 96 % (12/19 1200) Weight:  [56 kg (123 lb 7.3 oz)] 123 lb 7.3 oz (56 kg) (12/18 2100) Weight change: 3.2 kg (7 lb 0.9 oz)    Intake/Output from previous day:  Intake/Output Summary (Last 24 hours) at 09/17/11 1403 Last data filed at 09/17/11 1300  Gross per 24 hour  Intake   4000 ml  Output    725 ml  Net   3275 ml     Physical Exam:  Gen:  NAD, pleasantly confused. Cardiovascular:  RRR, No M/R/G Respiratory: Lungs CTAB Gastrointestinal: Abdomen soft, NT/ND with normal active bowel sounds. Extremities: No C/E/C     Lab Results: Basic Metabolic Panel:  Lab 09/17/11 2130 09/16/11 0100 09/15/11 1332  NA 149* 146* 146*  K 3.7 3.5 --  CL 118* 117* 112  CO2 22 22 24   GLUCOSE 180*  166* 242*  BUN 30* 38* 40*  CREATININE 0.82 0.88 1.03  CALCIUM 8.5 8.6 9.3  MG -- -- --  PHOS -- -- --   GFR Estimated Creatinine Clearance: 36.2 ml/min (by C-G formula based on Cr of 0.82). Liver Function Tests:  Lab 09/15/11 1332  AST 43*  ALT 21  ALKPHOS 51  BILITOT 0.2*  PROT 6.2  ALBUMIN 2.8*   Coagulation profile  Lab 09/17/11 0315  INR 1.15  PROTIME --    CBC:  Lab 09/17/11 0315 09/16/11 0100 09/15/11 1332  WBC 8.8 6.7 7.7  NEUTROABS -- -- 5.6  HGB 9.8* 9.8* 10.5*  HCT 29.6* 29.5* 31.9*  MCV 91.1 89.1 88.9  PLT 189 183 208   Cardiac Enzymes:  Lab 09/16/11 0100 09/15/11 1720 09/15/11 1339  CKTOTAL 124 162 158  CKMB 3.5 2.6 --  CKMBINDEX -- -- --  TROPONINI <0.30 <0.30 --   CBG:  Lab 09/17/11 1213 09/17/11 0730 09/17/11 0308 09/16/11 2346 09/16/11 2118  GLUCAP 180* 178* 157* 184* 184*   Hgb A1c  Basename 09/16/11 0100  HGBA1C 8.6*   Microbiology Recent Results (from the past 240 hour(s))  MRSA PCR SCREENING     Status: Normal   Collection Time   09/15/11 11:30 PM      Component Value Range Status Comment   MRSA by PCR NEGATIVE  NEGATIVE  Final     Recent Results (from the past 240 hour(s))  MRSA PCR SCREENING  Status: Normal   Collection Time   09/15/11 11:30 PM      Component Value Range Status Comment   MRSA by PCR NEGATIVE  NEGATIVE  Final     Studies/Results: Dg Femur Right  Sep 30, 2011  IMPRESSION: ORIF right intertrochanteric fracture.  Original Report Authenticated By: Bernadene Bell. Maricela Curet, M.D.   Ct Head Wo Contrast  09/15/2011 IMPRESSION: Stable atrophy, chronic microvascular ischemic changes and ventricular enlargement.  No definite acute intracranial process by CT.  Original Report Authenticated By: Judie Petit. Ruel Favors, M.D.    Medications: Scheduled Meds:   . docusate sodium  100 mg Oral BID  . enoxaparin  40 mg Subcutaneous Q24H  . fentaNYL      . hydrALAZINE      . insulin aspart  0-9 Units Subcutaneous Q4H  .  memantine  10 mg Oral BID  . metoprolol tartrate  25 mg Oral BID  . pantoprazole (PROTONIX) IV  40 mg Intravenous Q1200  . vitamin A & D      . DISCONTD: ceFAZolin (ANCEF) IV  1 g Intravenous 60 min Pre-Op  . DISCONTD: hydrALAZINE  5 mg Intravenous Q5 min   Continuous Infusions:   . sodium chloride 50 mL/hr at 2011/09/30 1126  . sodium chloride 75 mL/hr at 2011-09-30 2230  . lactated ringers    . lactated ringers     PRN Meds:.acetaminophen, acetaminophen, albuterol, fentaNYL, hydrALAZINE, HYDROcodone-acetaminophen, LORazepam, morphine, ondansetron (ZOFRAN) IV, ondansetron, promethazine Antibiotics: Anti-infectives     Start     Dose/Rate Route Frequency Ordered Stop   09-30-11 0215   ceFAZolin (ANCEF) IVPB 1 g/50 mL premix  Status:  Discontinued        1 g 100 mL/hr over 30 Minutes Intravenous 60 min pre-op 30-Sep-2011 0207 2011/09/30 1615           Assessment/Plan:  Principal Problem:  *Closed intertrochanteric fracture of right hip The patient was admitted and a diagnostic evaluation was undertaken which ultimately revealed an acute comminuted right hip fracture. Patient was seen and evaluated by the orthopedic surgeon who ultimately took her for an intramedullary nail fixation on 2011-09-30 once she was medically cleared. She has been seen by physical and occupational therapy postoperatively with the ultimate plan to discharge her to a skilled nursing facility for further rehabilitation. Active Problems:  CAD (coronary artery disease) of artery bypass graft H/O CABG-no notes available in e-chart. Had CABG in '84, then revision with 4 stents as well. 2 sets of cardiac markers were negative. Patient is clinically stable with no complaints of chest pain.  Acute hypernatremia Likely secondary to dehydration and volume contraction. We'll change her to a hypotonic IV fluids.  Unsteady gait Patient has been seen and evaluated by both physical and occupational therapy and will likely need  inpatient rehabilitation prior to going back home.  HTN (hypertension), benign The patient is currently on her home dose of metoprolol but her enalapril and diltiazem remains on hold. We'll continue to monitor her blood pressure closely and resume these medications when it is safe to do so.  DM type 2 (diabetes mellitus, type 2) Patient's hemoglobin A1c of 8.6 indicates poor outpatient control. Her CBGs have ranged from 157-184 over the past 24 hours. She appears to have been only on 10 units of Lantus prior to admission. She is currently on sliding scale insulin, the sensitive scale, every 4 hours. We'll change this to q. a.c. and at bedtime and resume her Lantus therapy. Will advance her diet to  carbohydrate modified.  Dementia, vascular with delirium Patient will likely need rehabilitation. She has a Comptroller in the room with her.  ARF (acute renal failure)/CKD (chronic kidney disease) stage 3, GFR 30-59 ml/min Patient's renal function has improved with IV fluid hydration. She likely has underlying stage III chronic kidney disease. Her baseline renal function is not currently known.  Normocytic anemia Patient likely has anemia of chronic disease related to her chronic kidney disease. She had a mild drop in her hemoglobin postoperatively.    LOS: 2 days   Hillery Aldo, MD Pager [phone]  09/17/2011, 2:03 PM

## 2011-09-17 NOTE — Progress Notes (Signed)
Initial visit with pt in response to spiritual care consult.  Pt was with health care provider who has worked with family / pt for 5 years.    Pt was lethargic and asked chaplain to return tomorrow.    Introduced spiritual care as resource and provided support to health care provider in room.  Will continue to follow pt and family during admission.    09/17/11 1700  Clinical Encounter Type  Visited With Patient;Health care provider  Visit Type Initial;Spiritual support  Referral From Nurse

## 2011-09-18 DIAGNOSIS — E876 Hypokalemia: Secondary | ICD-10-CM | POA: Diagnosis not present

## 2011-09-18 LAB — BASIC METABOLIC PANEL
BUN: 28 mg/dL — ABNORMAL HIGH (ref 6–23)
Chloride: 116 mEq/L — ABNORMAL HIGH (ref 96–112)
GFR calc Af Amer: 67 mL/min — ABNORMAL LOW (ref >90)
Glucose, Bld: 166 mg/dL — ABNORMAL HIGH (ref 70–99)
Potassium: 3.2 mEq/L — ABNORMAL LOW (ref 3.5–5.1)
Sodium: 147 mEq/L — ABNORMAL HIGH (ref 135–145)

## 2011-09-18 LAB — GLUCOSE, CAPILLARY
Glucose-Capillary: 127 mg/dL — ABNORMAL HIGH (ref 70–99)
Glucose-Capillary: 150 mg/dL — ABNORMAL HIGH (ref 70–99)
Glucose-Capillary: 167 mg/dL — ABNORMAL HIGH (ref 70–99)

## 2011-09-18 LAB — CBC
Hemoglobin: 8.5 g/dL — ABNORMAL LOW (ref 12.0–15.0)
MCH: 29.1 pg (ref 26.0–34.0)
MCV: 90.4 fL (ref 78.0–100.0)
RBC: 2.92 MIL/uL — ABNORMAL LOW (ref 3.87–5.11)

## 2011-09-18 LAB — PROTIME-INR: Prothrombin Time: 15.7 seconds — ABNORMAL HIGH (ref 11.6–15.2)

## 2011-09-18 MED ORDER — INSULIN ASPART 100 UNIT/ML ~~LOC~~ SOLN
0.0000 [IU] | Freq: Every day | SUBCUTANEOUS | Status: DC
  Filled 2011-09-18: qty 3

## 2011-09-18 MED ORDER — SODIUM CHLORIDE 0.45 % IV SOLN
INTRAVENOUS | Status: DC
  Administered 2011-09-18 – 2011-09-20 (×4): via INTRAVENOUS
  Filled 2011-09-18 (×14): qty 1000

## 2011-09-18 MED ORDER — ENALAPRIL MALEATE 20 MG PO TABS
20.0000 mg | ORAL_TABLET | Freq: Every day | ORAL | Status: DC
  Administered 2011-09-18 – 2011-09-22 (×5): 20 mg via ORAL
  Filled 2011-09-18 (×5): qty 1

## 2011-09-18 MED ORDER — INSULIN ASPART 100 UNIT/ML ~~LOC~~ SOLN
0.0000 [IU] | Freq: Three times a day (TID) | SUBCUTANEOUS | Status: DC
  Administered 2011-09-20 (×2): 1 [IU] via SUBCUTANEOUS
  Administered 2011-09-20: 2 [IU] via SUBCUTANEOUS
  Filled 2011-09-18 (×2): qty 3

## 2011-09-18 MED ORDER — DILTIAZEM HCL ER COATED BEADS 180 MG PO CP24
180.0000 mg | ORAL_CAPSULE | Freq: Every day | ORAL | Status: DC
  Administered 2011-09-18 – 2011-09-22 (×5): 180 mg via ORAL
  Filled 2011-09-18 (×5): qty 1

## 2011-09-18 MED ORDER — INSULIN ASPART 100 UNIT/ML ~~LOC~~ SOLN
3.0000 [IU] | Freq: Three times a day (TID) | SUBCUTANEOUS | Status: DC
  Administered 2011-09-19: 3 [IU] via SUBCUTANEOUS
  Filled 2011-09-18: qty 3

## 2011-09-18 NOTE — Progress Notes (Signed)
Subjective: 2 Days Post-Op Procedure(s) (LRB): INTRAMEDULLARY (IM) NAIL FEMORAL (Right) Patient reports pain as mild.    Objective: Vital signs in last 24 hours: Temp:  [97.8 F (36.6 C)-98.2 F (36.8 C)] 98 F (36.7 C) (12/20 0800) Pulse Rate:  [64-96] 92  (12/20 0800) Resp:  [13-24] 20  (12/20 0800) BP: (123-217)/(26-84) 165/58 mmHg (12/20 0800) SpO2:  [93 %-97 %] 97 % (12/20 0800) Weight:  [60.2 kg (132 lb 11.5 oz)] 132 lb 11.5 oz (60.2 kg) (12/20 0000)  Intake/Output from previous day: 12/19 0701 - 12/20 0700 In: 2481.7 [P.O.:500; I.V.:1981.7] Out: 580 [Urine:580] Intake/Output this shift: Total I/O In: 200 [I.V.:200] Out: 20 [Urine:20]   Basename 09/18/11 0300 09/17/11 0315 09/16/11 0100 09/15/11 1332  HGB 8.5* 9.8* 9.8* 10.5*    Basename 09/18/11 0300 09/17/11 0315  WBC 7.4 8.8  RBC 2.92* 3.25*  HCT 26.4* 29.6*  PLT 203 189    Basename 09/18/11 0300 09/17/11 0315  NA 147* 149*  K 3.2* 3.7  CL 116* 118*  CO2 22 22  BUN 28* 30*  CREATININE 0.85 0.82  GLUCOSE 166* 180*  CALCIUM 8.3* 8.5    Basename 09/18/11 0300 09/17/11 0315  LABPT -- --  INR 1.22 1.15    R LE incisions dressed and dry.  Palpable pulses at foot.  active PF and DF of toes.  Assessment/Plan: 2 Days Post-Op Procedure(s) (LRB): INTRAMEDULLARY (IM) NAIL FEMORAL (Right) Up with therapy.  WBAT on R LE. Pt should follow up with me in 2 weeks in the office.  Call [phone] for an appointment. Please call [phone] with any questions.  Toni Arthurs 09/18/2011, 12:22 PM

## 2011-09-18 NOTE — Progress Notes (Signed)
CSW received referral for SNF placement. No family present currently and Pt confused. Will get CSW assigned, [name] to follow up on plan for SNF.  Shelby Burns, [place] for Ladene Artist 09/18/2011 11:09 AM

## 2011-09-18 NOTE — Progress Notes (Signed)
PATIENT DETAILS Name: Shelby Burns Age: 77 y.o. Sex: female Date of Birth: 07-10-1917 Admit Date: 09/15/2011 NFA:[id number],VHQION, MD  CONSULTS: 1.  Dr. Toni Arthurs, Orthopedics  Interval History: Ms. Seckel is a 77 year old female who was brought to the hospital for evaluation of right-sided hip pain after suffering from a fall 3 days prior to admission. Family was concerned because she was becoming weaker and more confused since the fall. Upon initial evaluation emergency department, the patient was found to have a right intertrochanteric comminuted fracture of her hip. She has been seen by the orthopedic surgeons who performed an intramedullary nail fixation of the femur on 09/16/2011.  ROS: Ms. Zobrist is pleasantly confused. Still with no appetite, but thirsty.  No c/o pain, nausea, vomiting.  Objective: Vital signs in last 24 hours: Temp:  [97.2 F (36.2 C)-98.2 F (36.8 C)] 98 F (36.7 C) (12/20 0800) Pulse Rate:  [63-96] 93  (12/20 0626) Resp:  [12-24] 21  (12/20 0626) BP: (123-217)/(26-84) 188/59 mmHg (12/20 0659) SpO2:  [93 %-97 %] 97 % (12/20 0626) Weight:  [60.2 kg (132 lb 11.5 oz)] 132 lb 11.5 oz (60.2 kg) (12/20 0000) Weight change: 4.2 kg (9 lb 4.2 oz)    Intake/Output from previous day:  Intake/Output Summary (Last 24 hours) at 09/18/11 0909 Last data filed at 09/18/11 0600  Gross per 24 hour  Intake 2231.67 ml  Output    580 ml  Net 1651.67 ml     Physical Exam:  Gen:  NAD, pleasantly confused. Cardiovascular:  RRR, No M/R/G Respiratory: Lungs CTAB Gastrointestinal: Abdomen soft, NT/ND with normal active bowel sounds. Extremities: No C/E/C     Lab Results: Basic Metabolic Panel:  Lab 09/18/11 6295 09/17/11 0315 09/16/11 0100 09/15/11 1332  NA 147* 149* 146* 146*  K 3.2* 3.7 -- --  CL 116* 118* 117* 112  CO2 22 22 22 24   GLUCOSE 166* 180* 166* 242*  BUN 28* 30* 38* 40*  CREATININE 0.85 0.82 0.88 1.03  CALCIUM 8.3* 8.5 8.6 9.3  MG --  -- -- --  PHOS -- -- -- --   GFR Estimated Creatinine Clearance: 34.9 ml/min (by C-G formula based on Cr of 0.85). Liver Function Tests:  Lab 09/15/11 1332  AST 43*  ALT 21  ALKPHOS 51  BILITOT 0.2*  PROT 6.2  ALBUMIN 2.8*   Coagulation profile  Lab 09/18/11 0300 09/17/11 0315  INR 1.22 1.15  PROTIME -- --    CBC:  Lab 09/18/11 0300 09/17/11 0315 09/16/11 0100 09/15/11 1332  WBC 7.4 8.8 6.7 7.7  NEUTROABS -- -- -- 5.6  HGB 8.5* 9.8* 9.8* 10.5*  HCT 26.4* 29.6* 29.5* 31.9*  MCV 90.4 91.1 89.1 88.9  PLT 203 189 183 208   Cardiac Enzymes:  Lab 09/16/11 0100 09/15/11 1720 09/15/11 1339  CKTOTAL 124 162 158  CKMB 3.5 2.6 --  CKMBINDEX -- -- --  TROPONINI <0.30 <0.30 --   CBG:  Lab 09/18/11 0758 09/17/11 2114 09/17/11 1649 09/17/11 1213 09/17/11 0730  GLUCAP 127* 158* 197* 180* 178*   Hgb A1c  Basename 09/16/11 0100  HGBA1C 8.6*   Microbiology Recent Results (from the past 240 hour(s))  MRSA PCR SCREENING     Status: Normal   Collection Time   09/15/11 11:30 PM      Component Value Range Status Comment   MRSA by PCR NEGATIVE  NEGATIVE  Final     Recent Results (from the past 240 hour(s))  MRSA PCR SCREENING  Status: Normal   Collection Time   09/15/11 11:30 PM      Component Value Range Status Comment   MRSA by PCR NEGATIVE  NEGATIVE  Final     Studies/Results: Dg Femur Right  Sep 20, 2011  IMPRESSION: ORIF right intertrochanteric fracture.  Original Report Authenticated By: Bernadene Bell. Maricela Curet, M.D.   Ct Head Wo Contrast  09/15/2011 IMPRESSION: Stable atrophy, chronic microvascular ischemic changes and ventricular enlargement.  No definite acute intracranial process by CT.  Original Report Authenticated By: Judie Petit. Ruel Favors, M.D.    Medications: Scheduled Meds:    . docusate sodium  100 mg Oral BID  . enoxaparin  40 mg Subcutaneous Q24H  . insulin aspart  0-5 Units Subcutaneous QHS  . insulin aspart  0-9 Units Subcutaneous TID WC  .  insulin glargine  10 Units Subcutaneous QHS  . memantine  10 mg Oral BID  . metoprolol tartrate  25 mg Oral BID  . pantoprazole (PROTONIX) IV  40 mg Intravenous Q1200  . vitamin A & D      . DISCONTD: insulin aspart  0-9 Units Subcutaneous Q4H   Continuous Infusions:    . sodium chloride 100 mL/hr at 09/18/11 0240  . DISCONTD: sodium chloride 50 mL/hr at Sep 20, 2011 1126  . DISCONTD: sodium chloride 75 mL/hr at September 20, 2011 2230  . DISCONTD: lactated ringers    . DISCONTD: lactated ringers     PRN Meds:.acetaminophen, acetaminophen, albuterol, hydrALAZINE, HYDROcodone-acetaminophen, LORazepam, morphine, ondansetron (ZOFRAN) IV, ondansetron, promethazine, DISCONTD: fentaNYL Antibiotics: Anti-infectives     Start     Dose/Rate Route Frequency Ordered Stop   09-20-11 0215   ceFAZolin (ANCEF) IVPB 1 g/50 mL premix  Status:  Discontinued        1 g 100 mL/hr over 30 Minutes Intravenous 60 min pre-op September 20, 2011 0207 2011/09/20 1615           Assessment/Plan:  Principal Problem:  *Closed intertrochanteric fracture of right hip The patient was admitted and a diagnostic evaluation was undertaken which ultimately revealed an acute comminuted right hip fracture. Patient was seen and evaluated by the orthopedic surgeon who ultimately took her for an intramedullary nail fixation on 20-Sep-2011 once she was medically cleared. She has been seen by physical and occupational therapy postoperatively with the ultimate plan to discharge her to a skilled nursing facility for further rehabilitation. Active Problems:  CAD (coronary artery disease) of artery bypass graft H/O CABG-no notes available in e-chart. Had CABG in '84, then revision with 4 stents as well. 2 sets of cardiac markers were negative. Patient is clinically stable with no complaints of chest pain.  Acute hypernatremia Likely secondary to dehydration and volume contraction. Improved with change to hypotonic IV fluids.  Unsteady gait Patient has  been seen and evaluated by both physical and occupational therapy and will likely need inpatient rehabilitation prior to going back home.  HTN (hypertension), benign The patient is currently on her home dose of metoprolol but her enalapril and diltiazem remains on hold. Will resume given her elevated blood pressures over the past 24 hours.  DM type 2 (diabetes mellitus, type 2) Patient's hemoglobin A1c of 8.6 indicates poor outpatient control. Her CBGs have ranged from 127-197 over the past 24 hours. She appears to have been only on 10 units of Lantus prior to admission. She is currently on sliding scale insulin, the sensitive scale, q. a.c. and at bedtime.  We resumed her Lantus therapy 09/17/11. Diet advanced to carbohydrate modified on 09/17/11 as well.  Will add 3 units of Novalog Q AC for meal coverage.  Dementia, vascular with delirium Patient will likely need rehabilitation.   ARF (acute renal failure)/CKD (chronic kidney disease) stage 3, GFR 30-59 ml/min Patient's renal function has improved with IV fluid hydration. She likely has underlying stage III chronic kidney disease. Her baseline renal function is not currently known.  Normocytic anemia Patient likely has anemia of chronic disease related to her chronic kidney disease. She had a mild drop in her hemoglobin postoperatively.  Continue to monitor.  Hypokalemia Likely due to inadequate intake.  Add KCL to IVF.    LOS: 3 days   Hillery Aldo, MD Pager [phone]  09/18/2011, 9:09 AM

## 2011-09-18 NOTE — Progress Notes (Signed)
CSW spoke with pts daughter [name] [phone] over the phone who reports pt lived at home with her. Pts daughter is agreeable to SNF and would like pt to go to rehab so pt can gain some strenght. Pts daughter would like the pt go to either [facility] or [facility]. CSW will continue to follow the pt and offer support.  Mahala Menghini 09/18/2011 4:31 PM [phone]

## 2011-09-19 ENCOUNTER — Encounter (HOSPITAL_COMMUNITY): Payer: Self-pay | Admitting: Orthopedic Surgery

## 2011-09-19 LAB — BASIC METABOLIC PANEL
BUN: 24 mg/dL — ABNORMAL HIGH (ref 6–23)
CO2: 21 mEq/L (ref 19–32)
Calcium: 8.2 mg/dL — ABNORMAL LOW (ref 8.4–10.5)
Creatinine, Ser: 0.72 mg/dL (ref 0.50–1.10)
GFR calc non Af Amer: 72 mL/min — ABNORMAL LOW (ref >90)
Glucose, Bld: 88 mg/dL (ref 70–99)
Sodium: 142 mEq/L (ref 135–145)

## 2011-09-19 LAB — GLUCOSE, CAPILLARY
Glucose-Capillary: 71 mg/dL (ref 70–99)
Glucose-Capillary: 103 mg/dL — ABNORMAL HIGH (ref 70–99)
Glucose-Capillary: 126 mg/dL — ABNORMAL HIGH (ref 70–99)

## 2011-09-19 LAB — CBC
MCHC: 33.2 g/dL (ref 30.0–36.0)
MCV: 87.3 fL (ref 78.0–100.0)
Platelets: 257 10*3/uL (ref 150–400)
RDW: 13.8 % (ref 11.5–15.5)
WBC: 9.3 10*3/uL (ref 4.0–10.5)

## 2011-09-19 NOTE — Progress Notes (Signed)
CSW presented bed offers to the pts daughter over the phone. Pts daughter would like pt to go to [facility]. CSW has informed Kelli at the facility she would like to come to the facility for short term rehab. Please note the facility is admitting pt until noon on Monday and no admissions on 09-23-11. CSW will continue to follow and offer support.  Golden Pop 09/19/2011 3:42 PM [phone]

## 2011-09-19 NOTE — Progress Notes (Signed)
PATIENT DETAILS Name: Shelby Burns Age: 77 y.o. Sex: female Date of Birth: 03/14/1917 Admit Date: 09/15/2011 AVW:[id number],JYNWGN, MD  CONSULTS: 1.  Dr. Toni Arthurs, Orthopedics  Interval History: Shelby Burns is a 77 year old female who was brought to the hospital for evaluation of right-sided hip pain after suffering from a fall 3 days prior to admission. Family was concerned because she was becoming weaker and more confused since the fall. Upon initial evaluation emergency department, the patient was found to have a right intertrochanteric comminuted fracture of her hip. She has been seen by the orthopedic surgeons who performed an intramedullary nail fixation of the femur on 09/16/2011.  ROS: Shelby Burns is doing well, post-operatively. Appetite improving.  No c/o pain, nausea, vomiting.  States her bowels moved yesterday.  Objective: Vital signs in last 24 hours: Temp:  [98 F (36.7 C)-98.8 F (37.1 C)] 98 F (36.7 C) (12/21 0430) Pulse Rate:  [76-96] 76  (12/21 0727) Resp:  [16-20] 20  (12/21 0430) BP: (151-191)/(44-71) 171/71 mmHg (12/21 0727) SpO2:  [96 %] 96 % (12/21 0430) Weight change:  Last BM Date: 09/12/11  Intake/Output from previous day:  Intake/Output Summary (Last 24 hours) at 09/19/11 1410 Last data filed at 09/19/11 0549  Gross per 24 hour  Intake    840 ml  Output    555 ml  Net    285 ml     Physical Exam:  Gen:  NAD, pleasantly confused. Cardiovascular:  RRR, No M/R/G Respiratory: Lungs CTAB Gastrointestinal: Abdomen soft, NT/ND with normal active bowel sounds. Extremities: No C/E/C     Lab Results: Basic Metabolic Panel:  Lab 09/19/11 5621 09/18/11 0300 09/17/11 0315 09/16/11 0100 09/15/11 1332  NA 142 147* 149* 146* 146*  K 3.6 3.2* -- -- --  CL 114* 116* 118* 117* 112  CO2 21 22 22 22 24   GLUCOSE 88 166* 180* 166* 242*  BUN 24* 28* 30* 38* 40*  CREATININE 0.72 0.85 0.82 0.88 1.03  CALCIUM 8.2* 8.3* 8.5 8.6 9.3  MG -- -- -- --  --  PHOS -- -- -- -- --   GFR Estimated Creatinine Clearance: 37.1 ml/min (by C-G formula based on Cr of 0.72). Liver Function Tests:  Lab 09/15/11 1332  AST 43*  ALT 21  ALKPHOS 51  BILITOT 0.2*  PROT 6.2  ALBUMIN 2.8*   Coagulation profile  Lab 09/19/11 0505 09/18/11 0300 09/17/11 0315  INR 1.19 1.22 1.15  PROTIME -- -- --    CBC:  Lab 09/19/11 0505 09/18/11 0300 09/17/11 0315 09/16/11 0100 09/15/11 1332  WBC 9.3 7.4 8.8 6.7 7.7  NEUTROABS -- -- -- -- 5.6  HGB 8.0* 8.5* 9.8* 9.8* 10.5*  HCT 24.1* 26.4* 29.6* 29.5* 31.9*  MCV 87.3 90.4 91.1 89.1 88.9  PLT 257 203 189 183 208   Cardiac Enzymes:  Lab 09/16/11 0100 09/15/11 1720 09/15/11 1339  CKTOTAL 124 162 158  CKMB 3.5 2.6 --  CKMBINDEX -- -- --  TROPONINI <0.30 <0.30 --   CBG:  Lab 09/19/11 1125 09/19/11 0846 09/19/11 0758 09/18/11 2134 09/18/11 1721  GLUCAP 126* 103* 71 167* 164*   Hgb A1c No results found for this basename: HGBA1C:2 in the last 72 hours Microbiology Recent Results (from the past 240 hour(s))  MRSA PCR SCREENING     Status: Normal   Collection Time   09/15/11 11:30 PM      Component Value Range Status Comment   MRSA by PCR NEGATIVE  NEGATIVE  Final  Recent Results (from the past 240 hour(s))  MRSA PCR SCREENING     Status: Normal   Collection Time   09/15/11 11:30 PM      Component Value Range Status Comment   MRSA by PCR NEGATIVE  NEGATIVE  Final     Studies/Results: Dg Femur Right  09-20-11  IMPRESSION: ORIF right intertrochanteric fracture.  Original Report Authenticated By: Bernadene Bell. Maricela Curet, M.D.   Ct Head Wo Contrast  09/15/2011 IMPRESSION: Stable atrophy, chronic microvascular ischemic changes and ventricular enlargement.  No definite acute intracranial process by CT.  Original Report Authenticated By: Judie Petit. Ruel Favors, M.D.    Medications: Scheduled Meds:    . diltiazem  180 mg Oral Daily  . docusate sodium  100 mg Oral BID  . enalapril  20 mg Oral  Daily  . enoxaparin  40 mg Subcutaneous Q24H  . insulin aspart  0-5 Units Subcutaneous QHS  . insulin aspart  0-9 Units Subcutaneous TID WC  . insulin aspart  3 Units Subcutaneous TID WC  . insulin glargine  10 Units Subcutaneous QHS  . memantine  10 mg Oral BID  . metoprolol tartrate  25 mg Oral BID  . pantoprazole (PROTONIX) IV  40 mg Intravenous Q1200   Continuous Infusions:    . sodium chloride 0.45 % with kcl 100 mL/hr at 09/18/11 2224   PRN Meds:.acetaminophen, acetaminophen, albuterol, hydrALAZINE, HYDROcodone-acetaminophen, LORazepam, morphine, ondansetron (ZOFRAN) IV, ondansetron, DISCONTD: promethazine Antibiotics: Anti-infectives     Start     Dose/Rate Route Frequency Ordered Stop   Sep 20, 2011 0215   ceFAZolin (ANCEF) IVPB 1 g/50 mL premix  Status:  Discontinued        1 g 100 mL/hr over 30 Minutes Intravenous 60 min pre-op 20-Sep-2011 0207 09-20-2011 1615           Assessment/Plan:  Principal Problem:  *Closed intertrochanteric fracture of right hip The patient was admitted and a diagnostic evaluation was undertaken which ultimately revealed an acute comminuted right hip fracture. Patient was seen and evaluated by the orthopedic surgeon who ultimately took her for an intramedullary nail fixation on 2011-09-20 once she was medically cleared. She has been seen by physical and occupational therapy postoperatively with the ultimate plan to discharge her to a skilled nursing facility for further rehabilitation. Active Problems:  CAD (coronary artery disease) of artery bypass graft H/O CABG-no notes available in e-chart. Had CABG in '84, then revision with 4 stents as well. 2 sets of cardiac markers were negative. Patient is clinically stable with no complaints of chest pain.  Acute hypernatremia Likely secondary to dehydration and volume contraction. Resolved with change to hypotonic IV fluids.  Unsteady gait Patient has been seen and evaluated by both physical and  occupational therapy and will likely need inpatient rehabilitation prior to going back home.  HTN (hypertension), benign The patient is currently on her home dose of metoprolol, enalapril and diltiazem.  Her BP remains elevated at time.  May be due to pain.  Will monitor and adjust her medicines if the trend continues.  DM type 2 (diabetes mellitus, type 2) Patient's hemoglobin A1c of 8.6 indicates poor outpatient control. Her CBGs have ranged from 71-167 over the past 24 hours. She appears to have been only on 10 units of Lantus prior to admission. She is currently on sliding scale insulin, the sensitive scale, q. a.c. and at bedtime.  We resumed her Lantus therapy 09/17/11. Diet advanced to carbohydrate modified on 09/17/11 as well.  We add 3  units of Novalog Q AC for meal coverage on 09/18/11.  Her glycemic control is currently stable.  Dementia, vascular with delirium Patient will likely need rehabilitation.   ARF (acute renal failure)/CKD (chronic kidney disease) stage 3, GFR 30-59 ml/min Patient's renal function has improved with IV fluid hydration. She likely has underlying stage III chronic kidney disease. Her baseline renal function is not currently known.  Normocytic anemia Patient likely has anemia of chronic disease related to her chronic kidney disease. She had a mild drop in her hemoglobin postoperatively.  Continue to monitor.  Hypokalemia Likely due to inadequate intake.  Corrected when we added KCL to IVF.    LOS: 4 days   Hillery Aldo, MD Pager [phone]  09/19/2011, 2:10 PM

## 2011-09-20 LAB — PROTIME-INR: Prothrombin Time: 15.4 seconds — ABNORMAL HIGH (ref 11.6–15.2)

## 2011-09-20 LAB — CBC
MCH: 29.2 pg (ref 26.0–34.0)
MCHC: 33.3 g/dL (ref 30.0–36.0)
MCV: 87.5 fL (ref 78.0–100.0)
Platelets: 254 10*3/uL (ref 150–400)

## 2011-09-20 LAB — TYPE AND SCREEN
Antibody Screen: NEGATIVE
Unit division: 0
Unit division: 0

## 2011-09-20 LAB — GLUCOSE, CAPILLARY
Glucose-Capillary: 150 mg/dL — ABNORMAL HIGH (ref 70–99)
Glucose-Capillary: 171 mg/dL — ABNORMAL HIGH (ref 70–99)

## 2011-09-20 MED ORDER — FERROUS SULFATE 325 (65 FE) MG PO TABS
325.0000 mg | ORAL_TABLET | Freq: Two times a day (BID) | ORAL | Status: DC
  Administered 2011-09-20 – 2011-09-22 (×4): 325 mg via ORAL
  Filled 2011-09-20 (×5): qty 1

## 2011-09-20 NOTE — Progress Notes (Signed)
Physical Therapy Treatment Patient Details Name: Shelby Burns MRN: [id number] DOB: 1916-12-19 Today's Date: 09/20/2011 [id number] ta PT Assessment/Plan  PT - Assessment/Plan Comments on Treatment Session: pt seems more confused.  progress expected to be slow. PT Plan: Discharge plan remains appropriate;Frequency remains appropriate PT Frequency: Min 3X/week Follow Up Recommendations: Skilled nursing facility Equipment Recommended: Defer to next venue PT Goals  Acute Rehab PT Goals PT Goal Formulation: Patient unable to participate in goal setting Time For Goal Achievement: 2 weeks Pt will go Supine/Side to Sit: with min assist PT Goal: Supine/Side to Sit - Progress: Progressing toward goal Pt will go Sit to Stand: with min assist PT Goal: Sit to Stand - Progress: Progressing toward goal Pt will Transfer Bed to Chair/Chair to Bed: with min assist PT Transfer Goal: Bed to Chair/Chair to Bed - Progress: Progressing toward goal  PT Treatment Precautions/Restrictions  Precautions Precautions: Fall Required Braces or Orthoses: No Restrictions Weight Bearing Restrictions: No RLE Weight Bearing: Weight bearing as tolerated Mobility (including Balance) Bed Mobility Bed Mobility: Yes  Sit to Supine - Left: 1: +2 Total assist;HOB flat (pt=0) Sit to Supine - Left Details (indicate cue type and reason): pt unable to assist Scooting to [facility]: 1: +2 Total assist;Patient percentage (comment) Transfers Transfers: Yes Sit to Stand: 1: +2 Total assist;From chair/3-in-1 Sit to Stand Details (indicate cue type and reason): attempted at RW, pt unable to stand erect enough to pivot Stand to Sit: 1: +2 Total assist;To bed Stand Pivot Transfers: 1: +2 Total assist Stand Pivot Transfer Details (indicate cue type and reason): pt lifted by 2 persons from recliner and turned to bed  Ambulation/Gait Stairs: No    Exercise    End of Session PT - End of Session Equipment Utilized During  Treatment: Gait belt Activity Tolerance: Patient limited by pain (limited by weaknes, inability to utilize RW) Patient left: with family/visitor present;with call bell in reach;in bed Nurse Communication: Mobility status for transfers General Behavior During Session: [facility] for tasks performed (confused) Cognition: Impaired, at baseline  Shelby Burns 09/20/2011, 4:18 PM

## 2011-09-20 NOTE — Progress Notes (Signed)
Physical Therapy Treatment Patient Details Name: Shelby Burns MRN: [id number] DOB: Nov 03, 1916 Today's Date: 09/20/2011 [id number] ta PT Assessment/Plan  PT - Assessment/Plan Comments on Treatment Session: pt did tolerate moving to recliner, very frail, unable to utilize RW but did stand at it x 2.  Pt will benefit from SNF.  PT Plan: Discharge plan remains appropriate;Frequency remains appropriate PT Frequency: Min 3X/week Follow Up Recommendations: Skilled nursing facility Equipment Recommended: Defer to next venue PT Goals  Acute Rehab PT Goals PT Goal Formulation: Patient unable to participate in goal setting Time For Goal Achievement: 2 weeks Pt will go Supine/Side to Sit: with min assist PT Goal: Supine/Side to Sit - Progress: Progressing toward goal Pt will go Sit to Stand: with min assist PT Goal: Sit to Stand - Progress: Progressing toward goal Pt will Transfer Bed to Chair/Chair to Bed: with min assist PT Transfer Goal: Bed to Chair/Chair to Bed - Progress: Progressing toward goal  PT Treatment Precautions/Restrictions  Precautions Precautions: Fall Required Braces or Orthoses: No Restrictions Weight Bearing Restrictions: No RLE Weight Bearing: Weight bearing as tolerated Mobility (including Balance) Bed Mobility Bed Mobility: Yes Supine to Sit: 1: +2 Total assist;HOB elevated (Comment degrees) (@45 , pt=10) Supine to Sit Details (indicate cue type and reason): pt made attempts to move LEs to edge, vc for technique.pt = <10 Transfers Transfers: Yes Sit to Stand: 1: +2 Total assist;From elevated surface;From bed Sit to Stand Details (indicate cue type and reason): pt assisted to stand at RW x 2, pt unable to stand for more that few seconds each, unable to take a step Stand to Sit: 1: +2 Total assist;To chair/3-in-1 Stand to Sit Details: pt lowered to recliner Stand Pivot Transfers: 1: +2 Total assist Stand Pivot Transfer Details (indicate cue type and reason): pt  lifted with UEs to stand and pivot to recliner Ambulation/Gait Stairs: No    Exercise  General Exercises - Lower Extremity Ankle Circles/Pumps: AAROM;Both;10 reps;Seated Long Arc Quad: Seated;10 reps;Both;AAROM End of Session PT - End of Session Equipment Utilized During Treatment: Gait belt Activity Tolerance: Patient limited by pain (limited by weaknes, inability to utilize RW) Patient left: in chair;with family/visitor present;with call bell in reach Nurse Communication: Mobility status for transfers General Behavior During Session: [facility] for tasks performed Cognition: Impaired, at baseline  [name] 09/20/2011, 12:46 PM

## 2011-09-20 NOTE — Progress Notes (Addendum)
PATIENT DETAILS Name: Shelby Burns Age: 77 y.o. Sex: female Date of Birth: [birth year] Admit Date: 09/15/2011 ZOX:[id number],WUJWJX, MD POA: Radene Knee [phone]  CONSULTS: 1.  Dr. Toni Arthurs, Orthopedics  Interval History: Shelby Burns is a 77 year old female who was brought to the hospital for evaluation of right-sided hip pain after suffering from a fall 3 days prior to admission. Family was concerned because she was becoming weaker and more confused since the fall. Upon initial evaluation emergency department, the patient was found to have a right intertrochanteric comminuted fracture of her hip. She has been seen by the orthopedic surgeons who performed an intramedullary nail fixation of the femur on 09/16/2011.  ROS: Shelby Burns is doing well, post-operatively. She is sitting up in the chair.  Appetite improving.  No c/o pain, nausea, vomiting. Pleasantly confused.   Objective: Vital signs in last 24 hours: Temp:  [98 F (36.7 C)-98.7 F (37.1 C)] 98 F (36.7 C) (12/22 0905) Pulse Rate:  [70-80] 79  (12/22 0905) Resp:  [14-20] 16  (12/22 0905) BP: (160-197)/(43-69) 197/45 mmHg (12/22 0905) SpO2:  [94 %-98 %] 98 % (12/22 0905) Weight change:  Last BM Date: 09/20/11  Intake/Output from previous day:  Intake/Output Summary (Last 24 hours) at 09/20/11 1351 Last data filed at 09/20/11 0555  Gross per 24 hour  Intake   1200 ml  Output   1125 ml  Net     75 ml     Physical Exam:  Gen:  NAD, pleasantly confused. Cardiovascular:  RRR, No M/R/G Respiratory: Lungs CTAB Gastrointestinal: Abdomen soft, NT/ND with normal active bowel sounds. Extremities: No C/E/C     Lab Results: Basic Metabolic Panel:  Lab 09/19/11 1308 09/18/11 0300 09/17/11 0315 09/16/11 0100 09/15/11 1332  NA 142 147* 149* 146* 146*  K 3.6 3.2* -- -- --  CL 114* 116* 118* 117* 112  CO2 21 22 22 22 24   GLUCOSE 88 166* 180* 166* 242*  BUN 24* 28* 30* 38* 40*  CREATININE 0.72 0.85  0.82 0.88 1.03  CALCIUM 8.2* 8.3* 8.5 8.6 9.3  MG -- -- -- -- --  PHOS -- -- -- -- --   GFR Estimated Creatinine Clearance: 37.1 ml/min (by C-G formula based on Cr of 0.72). Liver Function Tests:  Lab 09/15/11 1332  AST 43*  ALT 21  ALKPHOS 51  BILITOT 0.2*  PROT 6.2  ALBUMIN 2.8*   Coagulation profile  Lab 09/20/11 0511 09/19/11 0505 09/18/11 0300 09/17/11 0315  INR 1.19 1.19 1.22 1.15  PROTIME -- -- -- --    CBC:  Lab 09/20/11 0511 09/19/11 0505 09/18/11 0300 09/17/11 0315 09/16/11 0100 09/15/11 1332  WBC 9.6 9.3 7.4 8.8 6.7 --  NEUTROABS -- -- -- -- -- 5.6  HGB 7.5* 8.0* 8.5* 9.8* 9.8* --  HCT 22.5* 24.1* 26.4* 29.6* 29.5* --  MCV 87.5 87.3 90.4 91.1 89.1 --  PLT 254 257 203 189 183 --   Cardiac Enzymes:  Lab 09/16/11 0100 09/15/11 1720 09/15/11 1339  CKTOTAL 124 162 158  CKMB 3.5 2.6 --  CKMBINDEX -- -- --  TROPONINI <0.30 <0.30 --   CBG:  Lab 09/20/11 1207 09/20/11 0738 09/19/11 2249 09/19/11 1635 09/19/11 1125  GLUCAP 171* 150* 137* 125* 126*   Hgb A1c No results found for this basename: HGBA1C:2 in the last 72 hours Microbiology Recent Results (from the past 240 hour(s))  MRSA PCR SCREENING     Status: Normal   Collection Time   09/15/11 11:30  PM      Component Value Range Status Comment   MRSA by PCR NEGATIVE  NEGATIVE  Final     Recent Results (from the past 240 hour(s))  MRSA PCR SCREENING     Status: Normal   Collection Time   09/15/11 11:30 PM      Component Value Range Status Comment   MRSA by PCR NEGATIVE  NEGATIVE  Final     Studies/Results: Dg Femur Right  September 22, 2011  IMPRESSION: ORIF right intertrochanteric fracture.  Original Report Authenticated By: Bernadene Bell. Maricela Curet, M.D.   Ct Head Wo Contrast  09/15/2011 IMPRESSION: Stable atrophy, chronic microvascular ischemic changes and ventricular enlargement.  No definite acute intracranial process by CT.  Original Report Authenticated By: Judie Petit. Ruel Favors, M.D.     Medications: Scheduled Meds:    . diltiazem  180 mg Oral Daily  . docusate sodium  100 mg Oral BID  . enalapril  20 mg Oral Daily  . enoxaparin  40 mg Subcutaneous Q24H  . insulin aspart  0-5 Units Subcutaneous QHS  . insulin aspart  0-9 Units Subcutaneous TID WC  . insulin aspart  3 Units Subcutaneous TID WC  . insulin glargine  10 Units Subcutaneous QHS  . memantine  10 mg Oral BID  . metoprolol tartrate  25 mg Oral BID  . pantoprazole (PROTONIX) IV  40 mg Intravenous Q1200   Continuous Infusions:    . sodium chloride 0.45 % with kcl 100 mL/hr at 09/20/11 0729   PRN Meds:.acetaminophen, acetaminophen, albuterol, hydrALAZINE, HYDROcodone-acetaminophen, LORazepam, morphine, ondansetron (ZOFRAN) IV, ondansetron Antibiotics: Anti-infectives     Start     Dose/Rate Route Frequency Ordered Stop   09-22-2011 0215   ceFAZolin (ANCEF) IVPB 1 g/50 mL premix  Status:  Discontinued        1 g 100 mL/hr over 30 Minutes Intravenous 60 min pre-op 09/22/11 0207 2011/09/22 1615           Assessment/Plan:  Principal Problem:  *Closed intertrochanteric fracture of right hip The patient was admitted and a diagnostic evaluation was undertaken which ultimately revealed an acute comminuted right hip fracture. Patient was seen and evaluated by the orthopedic surgeon who ultimately took her for an intramedullary nail fixation on 09-22-2011 once she was medically cleared. She has been seen by physical and occupational therapy postoperatively with the ultimate plan to discharge her to a skilled nursing facility for further rehabilitation. Active Problems:  CAD (coronary artery disease) of artery bypass graft H/O CABG-no notes available in e-chart. Had CABG in '84, then revision with 4 stents as well. 2 sets of cardiac markers were negative. Patient is clinically stable with no complaints of chest pain.  Acute hypernatremia Likely secondary to dehydration and volume contraction. Resolved with  change to hypotonic IV fluids.  Unsteady gait Patient has been seen and evaluated by both physical and occupational therapy and will likely need inpatient rehabilitation prior to going back home.  HTN (hypertension), benign The patient is currently on her home dose of metoprolol, enalapril and diltiazem.  Her BP remains elevated at time.  May be due to pain.  Will monitor and adjust her medicines if the trend continues.  DM type 2 (diabetes mellitus, type 2) Patient's hemoglobin A1c of 8.6 indicates poor outpatient control. Her CBGs have ranged from 71-167 over the past 24 hours. She appears to have been only on 10 units of Lantus prior to admission. She is currently on sliding scale insulin, the sensitive scale, q. a.c.  and at bedtime.  We resumed her Lantus therapy 09/17/11. Diet advanced to carbohydrate modified on 09/17/11 as well.  We add 3 units of Novalog Q AC for meal coverage on 09/18/11.  Her glycemic control is currently stable.  Dementia, vascular with delirium Patient will likely need rehabilitation.   ARF (acute renal failure)/CKD (chronic kidney disease) stage 3, GFR 30-59 ml/min Patient's renal function has improved with IV fluid hydration. She likely has underlying stage III chronic kidney disease. Her baseline renal function is not currently known.  Normocytic anemia Patient likely has anemia of chronic disease related to her chronic kidney disease. She had a mild drop in her hemoglobin postoperatively.  Start on oral iron therapy.  Continue to monitor.  Hypokalemia Likely due to inadequate intake.  Corrected when we added KCL to IVF.    LOS: 5 days   Hillery Aldo, MD Pager [phone]  09/20/2011, 1:51 PM

## 2011-09-21 LAB — CBC
HCT: 25.7 % — ABNORMAL LOW (ref 36.0–46.0)
Hemoglobin: 8.6 g/dL — ABNORMAL LOW (ref 12.0–15.0)
MCHC: 33.5 g/dL (ref 30.0–36.0)
RBC: 2.95 MIL/uL — ABNORMAL LOW (ref 3.87–5.11)
WBC: 11.2 10*3/uL — ABNORMAL HIGH (ref 4.0–10.5)

## 2011-09-21 LAB — GLUCOSE, CAPILLARY
Glucose-Capillary: 177 mg/dL — ABNORMAL HIGH (ref 70–99)
Glucose-Capillary: 94 mg/dL (ref 70–99)
Glucose-Capillary: 105 mg/dL — ABNORMAL HIGH (ref 70–99)

## 2011-09-21 LAB — BASIC METABOLIC PANEL
BUN: 17 mg/dL (ref 6–23)
Chloride: 112 mEq/L (ref 96–112)
GFR calc non Af Amer: 74 mL/min — ABNORMAL LOW (ref >90)
Glucose, Bld: 112 mg/dL — ABNORMAL HIGH (ref 70–99)
Potassium: 3.8 mEq/L (ref 3.5–5.1)
Sodium: 140 mEq/L (ref 135–145)

## 2011-09-21 MED ORDER — METOPROLOL TARTRATE 50 MG PO TABS
50.0000 mg | ORAL_TABLET | Freq: Two times a day (BID) | ORAL | Status: DC
  Administered 2011-09-21 – 2011-09-22 (×2): 50 mg via ORAL
  Filled 2011-09-21 (×3): qty 1

## 2011-09-21 MED ORDER — NITROGLYCERIN 0.4 MG/HR TD PT24
0.4000 mg | MEDICATED_PATCH | Freq: Every day | TRANSDERMAL | Status: DC
  Filled 2011-09-21: qty 1

## 2011-09-21 MED ORDER — PANTOPRAZOLE SODIUM 40 MG PO TBEC
40.0000 mg | DELAYED_RELEASE_TABLET | Freq: Every day | ORAL | Status: DC
  Administered 2011-09-21 – 2011-09-22 (×2): 40 mg via ORAL
  Filled 2011-09-21: qty 1

## 2011-09-21 MED ORDER — NITROGLYCERIN 0.4 MG/HR TD PT24
1.2000 mg | MEDICATED_PATCH | Freq: Every day | TRANSDERMAL | Status: DC
  Administered 2011-09-21 – 2011-09-22 (×2): 1.2 mg via TRANSDERMAL
  Filled 2011-09-21 (×2): qty 3

## 2011-09-21 NOTE — Progress Notes (Signed)
PATIENT DETAILS Name: Shelby Burns Age: 77 y.o. Sex: female Date of Birth: [birth year] Admit Date: 09/15/2011 ZOX:[id number], MD POA: Radene Knee [phone]  CONSULTS: 1.  Dr. Toni Arthurs, Orthopedics  Interval History: Shelby Burns is a 77 year old female who was brought to the hospital for evaluation of right-sided hip pain after suffering from a fall 3 days prior to admission. Family was concerned because she was becoming weaker and more confused since the fall. Upon initial evaluation emergency department, the patient was found to have a right intertrochanteric comminuted fracture of her hip. She has been seen by the orthopedic surgeons who performed an intramedullary nail fixation of the femur on 09/16/2011.  ROS: Shelby Burns is doing well, post-operatively.  Appetite improving.  No c/o pain, nausea, vomiting. Remains pleasantly confused.   Objective: Vital signs in last 24 hours: Temp:  [97.4 F (36.3 C)-98.7 F (37.1 C)] 98.7 F (37.1 C) (12/23 0615) Pulse Rate:  [78-89] 88  (12/23 0615) Resp:  [18] 18  (12/23 0615) BP: (170-196)/(63-67) 196/63 mmHg (12/23 0615) SpO2:  [95 %-97 %] 96 % (12/23 0615) Weight change:  Last BM Date: 09/20/11  Intake/Output from previous day:  Intake/Output Summary (Last 24 hours) at 09/21/11 1356 Last data filed at 09/21/11 0500  Gross per 24 hour  Intake    200 ml  Output    900 ml  Net   -700 ml     Physical Exam:  Gen:  NAD, pleasantly confused. Cardiovascular:  RRR, No M/R/G Respiratory: Lungs CTAB Gastrointestinal: Abdomen soft, NT/ND with normal active bowel sounds. Extremities: No C/E/C     Lab Results: Basic Metabolic Panel:  Lab 09/21/11 1308 09/19/11 0505 09/18/11 0300 09/17/11 0315 09/16/11 0100  NA 140 142 147* 149* 146*  K 3.8 3.6 -- -- --  CL 112 114* 116* 118* 117*  CO2 20 21 22 22 22   GLUCOSE 112* 88 166* 180* 166*  BUN 17 24* 28* 30* 38*  CREATININE 0.68 0.72 0.85 0.82 0.88  CALCIUM 8.3*  8.2* 8.3* 8.5 8.6  MG -- -- -- -- --  PHOS -- -- -- -- --   GFR Estimated Creatinine Clearance: 37.1 ml/min (by C-G formula based on Cr of 0.68). Liver Function Tests:  Lab 09/15/11 1332  AST 43*  ALT 21  ALKPHOS 51  BILITOT 0.2*  PROT 6.2  ALBUMIN 2.8*   Coagulation profile  Lab 09/20/11 0511 09/19/11 0505 09/18/11 0300 09/17/11 0315  INR 1.19 1.19 1.22 1.15  PROTIME -- -- -- --    CBC:  Lab 09/21/11 0600 09/20/11 0511 09/19/11 0505 09/18/11 0300 09/17/11 0315 09/15/11 1332  WBC 11.2* 9.6 9.3 7.4 8.8 --  NEUTROABS -- -- -- -- -- 5.6  HGB 8.6* 7.5* 8.0* 8.5* 9.8* --  HCT 25.7* 22.5* 24.1* 26.4* 29.6* --  MCV 87.1 87.5 87.3 90.4 91.1 --  PLT 347 254 257 203 189 --   Cardiac Enzymes:  Lab 09/16/11 0100 09/15/11 1720 09/15/11 1339  CKTOTAL 124 162 158  CKMB 3.5 2.6 --  CKMBINDEX -- -- --  TROPONINI <0.30 <0.30 --   CBG:  Lab 09/21/11 1224 09/21/11 0736 09/20/11 2154 09/20/11 1656 09/20/11 1207  GLUCAP 81 94 177* 150* 171*   Hgb A1c No results found for this basename: HGBA1C:2 in the last 72 hours Microbiology Recent Results (from the past 240 hour(s))  MRSA PCR SCREENING     Status: Normal   Collection Time   09/15/11 11:30 PM  Component Value Range Status Comment   MRSA by PCR NEGATIVE  NEGATIVE  Final     Recent Results (from the past 240 hour(s))  MRSA PCR SCREENING     Status: Normal   Collection Time   09/15/11 11:30 PM      Component Value Range Status Comment   MRSA by PCR NEGATIVE  NEGATIVE  Final     Studies/Results: Dg Femur Right  10/09/11  IMPRESSION: ORIF right intertrochanteric fracture.  Original Report Authenticated By: Bernadene Bell. Maricela Curet, M.D.   Ct Head Wo Contrast  09/15/2011 IMPRESSION: Stable atrophy, chronic microvascular ischemic changes and ventricular enlargement.  No definite acute intracranial process by CT.  Original Report Authenticated By: Judie Petit. Ruel Favors, M.D.    Medications: Scheduled Meds:    .  diltiazem  180 mg Oral Daily  . docusate sodium  100 mg Oral BID  . enalapril  20 mg Oral Daily  . enoxaparin  40 mg Subcutaneous Q24H  . ferrous sulfate  325 mg Oral BID WC  . insulin aspart  0-5 Units Subcutaneous QHS  . insulin aspart  0-9 Units Subcutaneous TID WC  . insulin aspart  3 Units Subcutaneous TID WC  . insulin glargine  10 Units Subcutaneous QHS  . memantine  10 mg Oral BID  . metoprolol tartrate  25 mg Oral BID  . pantoprazole  40 mg Oral Q1200  . DISCONTD: pantoprazole (PROTONIX) IV  40 mg Intravenous Q1200   Continuous Infusions:    . sodium chloride 0.45 % with kcl 100 mL/hr at 09/20/11 0729   PRN Meds:.acetaminophen, acetaminophen, albuterol, hydrALAZINE, HYDROcodone-acetaminophen, LORazepam, morphine, ondansetron (ZOFRAN) IV, ondansetron Antibiotics: Anti-infectives     Start     Dose/Rate Route Frequency Ordered Stop   Oct 09, 2011 0215   ceFAZolin (ANCEF) IVPB 1 g/50 mL premix  Status:  Discontinued        1 g 100 mL/hr over 30 Minutes Intravenous 60 min pre-op 10/09/11 0207 09-Oct-2011 1615           Assessment/Plan:  Principal Problem:  *Closed intertrochanteric fracture of right hip The patient was admitted and a diagnostic evaluation was undertaken which ultimately revealed an acute comminuted right hip fracture. Patient was seen and evaluated by the orthopedic surgeon who ultimately took her for an intramedullary nail fixation on October 09, 2011 once she was medically cleared. She has been seen by physical and occupational therapy postoperatively with the ultimate plan to discharge her to a skilled nursing facility for further rehabilitation. Active Problems:  CAD (coronary artery disease) of artery bypass graft H/O CABG-no notes available in e-chart. Had CABG in '84, then revision with 4 stents as well. 2 sets of cardiac markers were negative. Patient is clinically stable with no complaints of chest pain.  Acute hypernatremia Likely secondary to dehydration  and volume contraction. Resolved with change to hypotonic IV fluids.  Unsteady gait Patient has been seen and evaluated by both physical and occupational therapy and will need SNF for rehabilitation prior to going back home.  HTN (hypertension), benign The patient is currently on her home dose of metoprolol, enalapril and diltiazem.  Her BP remains elevated at time.  Increase metoprolol.  D/C IVF and NSL IV.   DM type 2 (diabetes mellitus, type 2) Patient's hemoglobin A1c of 8.6 indicates poor outpatient control. Her CBGs have ranged from 81-177 over the past 24 hours. She appears to have been only on 10 units of Lantus prior to admission. She is currently on sliding scale  insulin, the sensitive scale, q. a.c. and at bedtime.  We resumed her Lantus therapy 09/17/11. Diet advanced to carbohydrate modified on 09/17/11 as well.  We add 3 units of Novalog Q AC for meal coverage on 09/18/11.  Her glycemic control remains stable.  Dementia, vascular with delirium Patient will be discharged to a SNF.   ARF (acute renal failure)/CKD (chronic kidney disease) stage 3, GFR 30-59 ml/min Patient's renal function has improved with IV fluid hydration. She likely has underlying stage III chronic kidney disease. Her baseline renal function is not currently known.  Normocytic anemia Patient likely has anemia of chronic disease related to her chronic kidney disease. She had a mild drop in her hemoglobin postoperatively.  We started her  on oral iron therapy 09/20/11, and her hemoglobin is up today.    Hypokalemia Likely due to inadequate intake.  Corrected when we added KCL to IVF.  The patient's daughter, Elnita Maxwell, was updated by telephone and understands the plan to d/c Shelby Burns to [facility] 09/22/11.  I answered all of her questions.   LOS: 6 days   Hillery Aldo, MD Pager [phone]  09/21/2011, 1:56 PM

## 2011-09-21 NOTE — Progress Notes (Signed)
Per MD, Pt medically ready for d/c.  MD requesting CSW contact SNF to see if facility can admit Pt today.    Contacted facility.  Supervisor to call CSW back.  Notified MD.  MD requests CSW to contact her on her cell phone.  Spoke with Marylene Land, supervisor at [facility], who stated that Pt's bed will not be ready until tomorrow.  Notified MD.  CSW to f/u tomorrow.  Providence Crosby, LCSWA Clinical Social Work [phone]

## 2011-09-21 NOTE — Progress Notes (Signed)
The patient is receiving Protonix by the intravenous route.  Based on criteria approved by the Pharmacy and Therapeutics Committee and the Medical Executive Committee, the medication is being converted to the equivalent oral dose form.  These criteria include: -No Active GI bleeding -Able to tolerate diet of full liquids (or better) or tube feeding OR able to tolerate other medications by the oral or enteral route  If you have any questions about this conversion, please contact the Pharmacy Department. Thank you.  Hessie Knows, PharmD, BCPS 09/21/2011 12:52 PM

## 2011-09-22 LAB — CBC
MCH: 29 pg (ref 26.0–34.0)
MCV: 87.1 fL (ref 78.0–100.0)
Platelets: 364 10*3/uL (ref 150–400)
RDW: 13.9 % (ref 11.5–15.5)
WBC: 10.8 10*3/uL — ABNORMAL HIGH (ref 4.0–10.5)

## 2011-09-22 LAB — GLUCOSE, CAPILLARY: Glucose-Capillary: 52 mg/dL — ABNORMAL LOW (ref 70–99)

## 2011-09-22 MED ORDER — HYDROCODONE-ACETAMINOPHEN 5-325 MG PO TABS: 1.0000 | ORAL_TABLET | Freq: Four times a day (QID) | ORAL | Status: AC | PRN

## 2011-09-22 MED ORDER — INSULIN ASPART 100 UNIT/ML ~~LOC~~ SOLN: 3.0000 [IU] | Freq: Three times a day (TID) | SUBCUTANEOUS | Status: DC

## 2011-09-22 MED ORDER — NITROGLYCERIN 0.4 MG/HR TD PT24: 3.0000 | MEDICATED_PATCH | Freq: Every day | TRANSDERMAL | Status: AC

## 2011-09-22 MED ORDER — ALBUTEROL SULFATE (5 MG/ML) 0.5% IN NEBU: 2.5000 mg | INHALATION_SOLUTION | RESPIRATORY_TRACT | Status: DC | PRN

## 2011-09-22 MED ORDER — ENOXAPARIN SODIUM 40 MG/0.4ML ~~LOC~~ SOLN: 40.0000 mg | SUBCUTANEOUS | Status: AC

## 2011-09-22 MED ORDER — FERROUS SULFATE 325 (65 FE) MG PO TABS: 325.0000 mg | ORAL_TABLET | Freq: Two times a day (BID) | ORAL | Status: DC

## 2011-09-22 MED ORDER — ACETAMINOPHEN 325 MG PO TABS: 650.0000 mg | ORAL_TABLET | Freq: Four times a day (QID) | ORAL | Status: AC | PRN

## 2011-09-22 MED ORDER — ENOXAPARIN SODIUM 40 MG/0.4ML ~~LOC~~ SOLN: 40.0000 mg | SUBCUTANEOUS | Status: DC

## 2011-09-22 MED ORDER — HYDROCODONE-ACETAMINOPHEN 5-325 MG PO TABS: 1.0000 | ORAL_TABLET | Freq: Four times a day (QID) | ORAL | Status: DC | PRN

## 2011-09-22 MED ORDER — UNABLE TO FIND: Status: DC

## 2011-09-22 NOTE — Progress Notes (Signed)
CSW has called pts daughter several times and left voice messages letting her know that pt is ready to discharge and paperwork needs to be signed at [facility] by 12 noon so pt can transport to the facility. CSW has faxed pts discharge summary to the facility for review.  Patrice Paradise, LCSWA 09/22/2011 10:41 AM [phone]

## 2011-09-22 NOTE — Progress Notes (Signed)
CSW faxed over discharge clinicals to [facility]. Pts daughter has requested that the facility fax paperwork to her to be completed so the pt can be admitted to the facility. CSW has complied all discharge clinicals to be transported with the pt. CSW has informed the pts daughter that transportation will be arranged for 12:30 per request from [facility]. CSW has informed charge nurse Josiah Lobo that transportation has been arranged. CSW signing off.  Ladene Artist N 09/22/2011 11:45 AM [phone]

## 2011-09-22 NOTE — Progress Notes (Signed)
Lenny Pastel, NP: notified  Pt Hgb is 7.2 today  Hgb 7.5 12/22 and then 8.6 12/23  Pt has not voided since foley removal at 1700 12/23 Pt was Bladder Scanned 12/24 @ 0150= 116 and again at 0530= 311 Pt was then I&O cathed at 0600 with 175ccs out  New order: do not place foley

## 2011-09-22 NOTE — Discharge Summary (Addendum)
Physician Discharge Summary  Patient ID: Shelby Burns MRN: [id number] DOB/AGE: 1917/01/08 77 y.o.  Admit date: 09/15/2011 Discharge date: 09/22/2011  Primary Care Physician:  Drucie Opitz, MD Orthopedic Surgeon: Dr. Toni Arthurs   Discharge Diagnoses:    Present on Admission:  .CAD (coronary artery disease) of artery bypass graft .Acute hypernatremia .Unsteady gait .HTN (hypertension), benign .DM type 2 (diabetes mellitus, type 2) .Dementia, vascular with delirium .Closed intertrochanteric fracture of right hip .ARF (acute renal failure) .CKD (chronic kidney disease) stage 3, GFR 30-59 ml/min .Normocytic anemia  Discharge Medications:  Current Discharge Medication List    START taking these medications   Details  acetaminophen (TYLENOL) 325 MG tablet Take 2 tablets (650 mg total) by mouth every 6 (six) hours as needed (or Fever >/= 101). Qty: 30 tablet    albuterol (PROVENTIL) (5 MG/ML) 0.5% nebulizer solution Take 0.5 mLs (2.5 mg total) by nebulization every 4 (four) hours as needed for wheezing. Qty: 20 mL    enoxaparin (LOVENOX) 40 MG/0.4ML SOLN Inject 0.4 mLs (40 mg total) into the skin daily. Qty: 11.2 mL    ferrous sulfate 325 (65 FE) MG tablet Take 1 tablet (325 mg total) by mouth 2 (two) times daily with a meal.    HYDROcodone-acetaminophen (NORCO) 5-325 MG per tablet Take 1 tablet by mouth every 6 (six) hours as needed. Qty: 30 tablet, Refills: 0    insulin aspart (NOVOLOG) 100 UNIT/ML injection Inject 3 Units into the skin 3 (three) times daily with meals. Qty: 1 vial    UNABLE TO FIND I & O cath Q 6 hours PRN urinary retention.      CONTINUE these medications which have CHANGED   Details  nitroGLYCERIN (NITRODUR - DOSED IN MG/24 HR) 0.4 mg/hr Place 3 patches (1.2 mg total) onto the skin daily. Qty: 30 patch      CONTINUE these medications which have NOT CHANGED   Details  B Complex Vitamins (VITAMIN B COMPLEX PO) Take 1 tablet by mouth daily.       BIOTIN PO Take 1 tablet by mouth daily.      Coenzyme Q10 (COQ10 PO) Take 1 tablet by mouth daily.      diltiazem (DILACOR XR) 180 MG 24 hr capsule Take 180 mg by mouth daily.      enalapril (VASOTEC) 20 MG tablet Take 20 mg by mouth 2 (two) times daily.      fenofibrate 160 MG tablet Take 1 tablet (160 mg total) by mouth daily. Qty: 90 tablet, Refills: 3    insulin glargine (LANTUS) 100 UNIT/ML injection Inject 10 Units into the skin at bedtime.      metoprolol tartrate (LOPRESSOR) 25 MG tablet Take 25-50 mg by mouth 2 (two) times daily. 2 in am, 1 in pm     NAMENDA 10 MG tablet TAKE 1 TABLET TWICE DAILY. Qty: 60 each, Refills: 3    nitroGLYCERIN (NITROSTAT) 0.4 MG SL tablet Place 0.4 mg under the tongue every 5 (five) minutes x 3 doses as needed. For chest pain.     omeprazole (PRILOSEC) 20 MG capsule Take 20 mg by mouth daily.           Disposition and Follow-up: The patient is being discharged to a SNF for rehab.  Shelby Burns is instructed to follow-up with Dr. Victorino Dike in 1 week and with her PCP in 2 weeks.  Consults: None  Significant Diagnostic Studies:  Dg Chest 1 View  09/15/2011  *RADIOLOGY REPORT*  Clinical Data: Fall, right hip  pain.  CHEST - 1 VIEW  Comparison: 11/11 of six  Findings: Prior CABG.  Chronic peribronchial thickening and interstitial prominence, likely chronic bronchitic changes.  No confluent opacities or effusions.  No acute bony abnormality.  IMPRESSION: Prior CABG.  Chronic bronchitic changes.  Original Report Authenticated By: Cyndie Chime, M.D.   Dg Hip Complete Right  09/15/2011  *RADIOLOGY REPORT*  Clinical Data: Fall, hip pain.  RIGHT HIP - COMPLETE 2+ VIEW  Comparison: None  Findings: There is a comminuted right intertrochanteric femoral fracture.  Mild varus angulation and displacement fracture fragments.  IMPRESSION: Comminuted right intertrochanteric fracture with varus angulation.  Original Report Authenticated By: Cyndie Chime, M.D.    Dg Femur Right  09/16/2011  *RADIOLOGY REPORT*  Clinical Data: Fracture fixation.  RIGHT FEMUR - 2 VIEW  Comparison: Plain films right hip 09/15/2011.  Findings: We are provided with four fluoroscopic intraoperative spot views of the right hip and femur.  Images demonstrate placement of a dynamic hip screw and long IM nail with two distal interlocking screws for fixation of an intertrochanteric fracture. No complicating feature is identified.  IMPRESSION: ORIF right intertrochanteric fracture.  Original Report Authenticated By: Bernadene Bell. Maricela Curet, M.D.   Ct Head Wo Contrast  09/15/2011  *RADIOLOGY REPORT*  Clinical Data: Fall, dementia  CT HEAD WITHOUT CONTRAST  Technique:  Contiguous axial images were obtained from the base of the skull through the vertex without contrast.  Comparison: 06/11/2009  Findings: Somewhat limited because of oblique scan acquisition. Diffuse brain atrophy noted with associated periventricular white matter microvascular ischemic changes and ventricular enlargement. No acute intracranial hemorrhage, definite acute infarction, focal edema, mass lesion, midline shift, herniation, hydrocephalus, or extra-axial fluid collection.  No definite cerebellar abnormality. Atherosclerosis of the intracranial vessels.  Mastoids and sinuses appear clear.  IMPRESSION: Stable atrophy, chronic microvascular ischemic changes and ventricular enlargement.  No definite acute intracranial process by CT.  Original Report Authenticated By: Judie Petit. Ruel Favors, M.D.   Dg C-arm 1-60 Min-no Report  09/16/2011  CLINICAL DATA: surgery   C-ARM 1-60 MINUTES  Fluoroscopy was utilized by the requesting physician.  No radiographic  interpretation.      Discharge Laboratory Values: Basic Metabolic Panel:  Lab 09/21/11 1610 09/19/11 0505 09/18/11 0300 09/17/11 0315 09/16/11 0100  NA 140 142 147* 149* 146*  K 3.8 3.6 -- -- --  CL 112 114* 116* 118* 117*  CO2 20 21 22 22 22   GLUCOSE 112* 88 166* 180* 166*   BUN 17 24* 28* 30* 38*  CREATININE 0.68 0.72 0.85 0.82 0.88  CALCIUM 8.3* 8.2* 8.3* 8.5 8.6  MG -- -- -- -- --  PHOS -- -- -- -- --   GFR Estimated Creatinine Clearance: 37.1 ml/min (by C-G formula based on Cr of 0.68). Liver Function Tests:  Lab 09/15/11 1332  AST 43*  ALT 21  ALKPHOS 51  BILITOT 0.2*  PROT 6.2  ALBUMIN 2.8*   Coagulation profile  Lab 09/20/11 0511 09/19/11 0505 09/18/11 0300 09/17/11 0315  INR 1.19 1.19 1.22 1.15  PROTIME -- -- -- --    CBC:  Lab 09/22/11 0410 09/21/11 0600 09/20/11 0511 09/19/11 0505 09/18/11 0300 09/15/11 1332  WBC 10.8* 11.2* 9.6 9.3 7.4 --  NEUTROABS -- -- -- -- -- 5.6  HGB 7.2* 8.6* 7.5* 8.0* 8.5* --  HCT 21.6* 25.7* 22.5* 24.1* 26.4* --  MCV 87.1 87.1 87.5 87.3 90.4 --  PLT 364 347 254 257 203 --   Cardiac Enzymes:  Lab 09/16/11 0100 09/15/11 1720 09/15/11 1339  CKTOTAL 124 162 158  CKMB 3.5 2.6 --  CKMBINDEX -- -- --  TROPONINI <0.30 <0.30 --   CBG:  Lab 09/22/11 0721 09/21/11 2245 09/21/11 1727 09/21/11 1224 09/21/11 0736  GLUCAP 52* 135* 105* 81 94    Microbiology Recent Results (from the past 240 hour(s))  MRSA PCR SCREENING     Status: Normal   Collection Time   09/15/11 11:30 PM      Component Value Range Status Comment   MRSA by PCR NEGATIVE  NEGATIVE  Final      Brief H and P: For complete details please refer to admission H and P, but in brief, Shelby Burns is a 77 year old female who was brought to the hospital for evaluation of right-sided hip pain after suffering from a fall 3 days prior to admission. Family was concerned because Shelby Burns was becoming weaker and more confused since the fall. Upon initial evaluation emergency department, the patient was found to have a right intertrochanteric comminuted fracture of her hip.   Physical Exam at Discharge: BP 164/55  Pulse 66  Temp(Src) 97.7 F (36.5 C) (Oral)  Resp 22  Ht 5\' 3"  (1.6 m)  Wt 60.2 kg (132 lb 11.5 oz)  BMI 23.51 kg/m2  SpO2 96% Gen:  NAD, pleasantly confused.  Cardiovascular: RRR, No M/R/G  Respiratory: Lungs CTAB  Gastrointestinal: Abdomen soft, NT/ND with normal active bowel sounds.  Extremities: No C/E/C  Hospital Course:  Principal Problem:  *Closed intertrochanteric fracture of right hip  The patient was admitted and a diagnostic evaluation was undertaken which ultimately revealed an acute comminuted right hip fracture. Patient was seen and evaluated by the orthopedic surgeon who ultimately took her for an intramedullary nail fixation on 09/16/2011 once Shelby Burns was medically cleared. Shelby Burns has been seen by physical and occupational therapy postoperatively with the ultimate plan to discharge her to a skilled nursing facility today for further rehabilitation.  Active Problems:  CAD (coronary artery disease) of artery bypass graft  H/O CABG-no notes available in e-chart. Had CABG in '84, then revision with 4 stents as well. 2 sets of cardiac markers were negative. Patient is clinically stable with no complaints of chest pain.  Acute hypernatremia  Likely secondary to dehydration and volume contraction. Resolved with change to hypotonic IV fluids.  Unsteady gait  Patient has been seen and evaluated by both physical and occupational therapy and will need SNF for rehabilitation prior to going back home.  HTN (hypertension), benign  The patient was put on her home dose of metoprolol, enalapril and diltiazem. Her BP remained elevated post-operatively so her metoprolol was increased and her IVF were discontinued.  We resumed her nitroglycerin patches on 09/21/11. Shelby Burns will need close monitoring of her BP post discharge. DM type 2 (diabetes mellitus, type 2)  Patient's hemoglobin A1c of 8.6 indicates poor outpatient control. Her CBGs have ranged from 81-177 over the past 24 hours. Shelby Burns appears to have been only on 10 units of Lantus prior to admission. Shelby Burns is currently on sliding scale insulin, the sensitive scale, q. a.c. and at bedtime.  We resumed her Lantus therapy 09/17/11. Diet advanced to carbohydrate modified on 09/17/11 as well. We add 3 units of Novalog Q AC for meal coverage on 09/18/11. Her glycemic control remains stable. We will discharge her on her usual dose of lantus with the addition of Novalog, 3 units Q AC. Dementia, vascular with delirium  Patient will be discharged to a  SNF.  ARF (acute renal failure)/CKD (chronic kidney disease) stage 3, GFR 30-59 ml/min  Patient's renal function has improved with IV fluid hydration. Shelby Burns likely has underlying stage III chronic kidney disease. Her baseline renal function is not currently known.  Normocytic anemia  Patient likely has anemia of chronic disease related to her chronic kidney disease. Shelby Burns had a mild drop in her hemoglobin postoperatively. We started her on oral iron therapy 09/20/11, and her hemoglobin was up 09/21/11.  Hypokalemia  Likely due to inadequate intake. Corrected when we added KCL to IVF. Post operative urinary retention The patient's foley was removed on 09/21/11.  Shelby Burns has required intermittent I&O cath and should be monitored closely for retention with I&O caths performed Q 6 hours as needed.   Recommendations for hospital follow-up: F/U with orthopedic surgeon.  Diet: Carbohydrate modified.  Activity: Walk with assistance, increase activity slowly  Condition at Discharge: Stable  Time spent on Discharge: 40 minutes  Signed: Dr. Trula Ore Rama Pager [phone] 09/22/2011, 10:53 AM

## 2011-09-24 LAB — GLUCOSE, CAPILLARY
Glucose-Capillary: 75 mg/dL (ref 70–99)
Glucose-Capillary: 110 mg/dL — ABNORMAL HIGH (ref 70–99)

## 2011-12-03 ENCOUNTER — Other Ambulatory Visit: Payer: Self-pay | Admitting: Cardiology

## 2011-12-03 DIAGNOSIS — F419 Anxiety disorder, unspecified: Secondary | ICD-10-CM

## 2011-12-04 NOTE — Telephone Encounter (Signed)
Refill ativan one time only,further refills should come from PCP

## 2012-01-02 ENCOUNTER — Other Ambulatory Visit: Payer: Self-pay | Admitting: Cardiology

## 2012-02-05 ENCOUNTER — Other Ambulatory Visit: Payer: Self-pay | Admitting: Cardiology

## 2012-02-05 NOTE — Telephone Encounter (Signed)
Refilled namenda one time

## 2012-02-27 ENCOUNTER — Encounter: Payer: Self-pay | Admitting: *Deleted

## 2012-03-07 ENCOUNTER — Other Ambulatory Visit: Payer: Self-pay | Admitting: Cardiology

## 2012-03-22 ENCOUNTER — Telehealth: Payer: Self-pay | Admitting: Cardiology

## 2012-03-22 NOTE — Telephone Encounter (Signed)
Agree with plan 

## 2012-03-22 NOTE — Telephone Encounter (Signed)
Daughter calls today b/c of increasing dizziness and increasing frequency of chest pain for the past week. States dizziness had becomed more pronounced this past 03-29-23 & Sunday.  Pt was awakened at 5:30am this morning with "severe chest pain". Relieved by 3 sl NTG. Pt is eating breakfast at this time and asymptomatic according to daughter. Daughter states " I feel this is building toward something more"  Appt made with Lawson Fiscal NP Tomorrow at 3pm (03/23/12). Reassurance given. Understands to call 911 if unrelieved by NTG or becomes worse. Mylo Red RN

## 2012-03-23 ENCOUNTER — Ambulatory Visit (INDEPENDENT_AMBULATORY_CARE_PROVIDER_SITE_OTHER): Payer: Medicare Other | Admitting: Nurse Practitioner

## 2012-03-23 ENCOUNTER — Encounter: Payer: Self-pay | Admitting: Nurse Practitioner

## 2012-03-23 VITALS — BP 128/60 | HR 60 | Ht 60.0 in | Wt 106.0 lb

## 2012-03-23 DIAGNOSIS — R079 Chest pain, unspecified: Secondary | ICD-10-CM

## 2012-03-23 DIAGNOSIS — I25810 Atherosclerosis of coronary artery bypass graft(s) without angina pectoris: Secondary | ICD-10-CM

## 2012-03-23 DIAGNOSIS — I10 Essential (primary) hypertension: Secondary | ICD-10-CM

## 2012-03-23 NOTE — Patient Instructions (Addendum)
Stop the Cardizem (Diltiazem).   Monitor the blood pressure and heart rate at home.  We may need to cut more medicines back  We will see you back in 3 to 4 weeks with Dr. Patty Sermons.  Call the [facility] office at [phone] if you have any questions, problems or concerns. Ask for Juliette Alcide - she is Dr. Yevonne Pax nurse

## 2012-03-23 NOTE — Assessment & Plan Note (Signed)
Patient has known CAD. Has used NTG with relief. Will continue with her current regimen and a conservative approach. Try to keep her on beta blocker as well.

## 2012-03-23 NOTE — Assessment & Plan Note (Signed)
Patient presents with dizziness. Has had low BP and heart rates reported by the daughter. Some chest pain reported as well that has been responsive to NTG. Patient has lost about 30 pounds of weight. Will stop her Diltiazem. Family will continue to monitor. May need to cut other medicines back as well but this should help with both the HR and BP. We will have Dr. Patty Sermons see her back in about 4 weeks. Patient's daughter is agreeable to this plan and will call if any problems develop in the interim.

## 2012-03-23 NOTE — Progress Notes (Signed)
Shelby Burns Date of Birth: 1917/06/29 Medical Record #[id number]  History of Present Illness: Shelby Burns is seen today for a work in visit. She is seen for Dr. Patty Sermons. She is now 78 years old. She remains at home with around the clock care. She has known CAD with remote CABG in 1992 and PCI in 2000 while living in [place]. Her other problems include dementia, HTN, HLD, and DM. She suffered a broken hip back in November and had to have surgery. Did surprisingly well and went to rehab for 3 months. Lost 30 pounds of weight.   She comes in today. She is here with her daughter, Elnita Maxwell, who provides the history. Her mother has not been seen by cardiology since February of 2012. Had been doing well. Has lost a significant amount of weight since her hip surgery. Daughter notes that for the past 10 days, her mother seems to be feeling dizzy. Blood pressure and heart rate have been running low. She has had some chest pain off and on. Had a prolonged episode of chest pain this past Sunday night and she ended up giving her 3 NTG and was able to get her back to sleep. She has been trying to get her to walk more and perhaps overdone it with her activity level. Her daughter is more concerned with the BP and heart rate being low. No syncope. She does not get up without assistance. Blood sugar continues to be an issue and is up and down. She tends to have some swelling but seems to control it with support stockings. She continues to be followed at the [facility] in [place] and by Dr. Debroah Loop in [facility].   Current Outpatient Prescriptions on File Prior to Visit  Medication Sig Dispense Refill  . ATIVAN 1 MG tablet TAKE (1) TABLET DAILY AS NEEDED.  90 each  0  . B Complex Vitamins (VITAMIN B COMPLEX PO) Take 1 tablet by mouth daily.        Marland Kitchen BIOTIN PO Take 600 mcg by mouth daily.       . Coenzyme Q10 (COQ10 PO) Take 1 tablet by mouth daily.        . enalapril (VASOTEC) 20 MG tablet Take 20 mg by mouth 2 (two) times  daily.        Marland Kitchen ezetimibe (ZETIA) 10 MG tablet Take 10 mg by mouth daily.      . fenofibrate 160 MG tablet Take 1 tablet (160 mg total) by mouth daily.  90 tablet  3  . insulin glargine (LANTUS) 100 UNIT/ML injection Inject 10 Units into the skin at bedtime.        . metoprolol tartrate (LOPRESSOR) 25 MG tablet Take 25-50 mg by mouth 2 (two) times daily. 2 in am, 1 in pm       . NAMENDA 10 MG tablet TAKE 1 TABLET TWICE DAILY.  60 each  11  . nitroGLYCERIN (NITRODUR - DOSED IN MG/24 HR) 0.4 mg/hr Place 3 patches (1.2 mg total) onto the skin daily.  30 patch    . nitroGLYCERIN (NITROSTAT) 0.4 MG SL tablet Place 0.4 mg under the tongue every 5 (five) minutes x 3 doses as needed. For chest pain.       Marland Kitchen omeprazole (PRILOSEC) 20 MG capsule Take 20 mg by mouth daily.        Marland Kitchen albuterol (PROVENTIL) (5 MG/ML) 0.5% nebulizer solution Take 0.5 mLs (2.5 mg total) by nebulization every 4 (four) hours as needed for  wheezing.  20 mL    . ferrous sulfate 325 (65 FE) MG tablet Take 1 tablet (325 mg total) by mouth 2 (two) times daily with a meal.      . insulin aspart (NOVOLOG) 100 UNIT/ML injection Inject 3 Units into the skin 3 (three) times daily with meals.  1 vial    . UNABLE TO FIND I & O cath Q 6 hours PRN urinary retention.        No Known Allergies  Past Medical History  Diagnosis Date  . Angina   . Anemia   . Diabetes mellitus   . Complication of anesthesia     ultra sensitive  . Dementia   . Ischemic heart disease   . HLD (hyperlipidemia)   . HTN (hypertension)   . Carotid bruit     bilateral    Past Surgical History  Procedure Date  . Coronary artery bypass graft     1984  . Coronary stents     4 all together  . Colon surgery approx. 1992    colon tumor benign  . Skin cancer excision     face - squamous cell  . Nasal reconstruction     rebuilt due to squamous cell ca  . Femur im nail 09/16/2011    Procedure: INTRAMEDULLARY (IM) NAIL FEMORAL;  Surgeon: Toni Arthurs, MD;   Location: [facility];  Service: Orthopedics;  Laterality: Right;    History  Smoking status  . Never Smoker   Smokeless tobacco  . Never Used    History  Alcohol Use No    Family History  Problem Relation Age of Onset  . Coronary artery disease      Review of Systems: The review of systems is positive for dementia.  All other systems were reviewed and are negative.  Physical Exam: BP 128/60  Pulse 60  Ht 5' (1.524 m)  Wt 106 lb (48.081 kg)  BMI 20.70 kg/m2 Patient is very pleasant elderly female who is in no acute distress. She is in a wheelchair. Skin is warm and dry. Color is normal.  HEENT is unremarkable. Normocephalic/atraumatic. PERRL. Sclera are nonicteric. Neck is supple. No masses. No JVD. Lungs are clear. Cardiac exam shows a regular rate and rhythm. Abdomen is soft. Extremities are without edema. Gait is not tested. ROM appears intact. No gross neurologic deficits noted.  LABORATORY DATA: EKG today shows sinus rhythm. She has inferior Q's. Rate is slower today at 55. Tracing is unchanged except for lower HR.    Assessment / Plan:

## 2012-04-07 ENCOUNTER — Other Ambulatory Visit: Payer: Self-pay | Admitting: Cardiology

## 2012-04-07 DIAGNOSIS — I209 Angina pectoris, unspecified: Secondary | ICD-10-CM

## 2012-04-07 NOTE — Telephone Encounter (Signed)
Refilled toprol and ativan

## 2012-06-06 ENCOUNTER — Other Ambulatory Visit: Payer: Self-pay | Admitting: Cardiology

## 2012-06-07 NOTE — Telephone Encounter (Signed)
Refilled ativan one time,needs appointment

## 2012-07-12 ENCOUNTER — Other Ambulatory Visit: Payer: Self-pay | Admitting: *Deleted

## 2012-07-12 MED ORDER — FENOFIBRATE 160 MG PO TABS: 160.0000 mg | ORAL_TABLET | Freq: Every day | ORAL | Status: DC

## 2012-07-12 MED ORDER — EZETIMIBE 10 MG PO TABS: 10.0000 mg | ORAL_TABLET | Freq: Every day | ORAL | Status: DC

## 2012-07-14 ENCOUNTER — Other Ambulatory Visit: Payer: Self-pay | Admitting: Cardiology

## 2012-07-14 DIAGNOSIS — F419 Anxiety disorder, unspecified: Secondary | ICD-10-CM

## 2012-08-18 ENCOUNTER — Encounter: Payer: Self-pay | Admitting: Cardiology

## 2012-10-28 ENCOUNTER — Other Ambulatory Visit: Payer: Self-pay | Admitting: *Deleted

## 2012-10-28 DIAGNOSIS — F419 Anxiety disorder, unspecified: Secondary | ICD-10-CM

## 2012-10-28 MED ORDER — LORAZEPAM 1 MG PO TABS: 1.0000 mg | ORAL_TABLET | Freq: Every day | ORAL | Status: DC | PRN

## 2013-03-07 ENCOUNTER — Other Ambulatory Visit: Payer: Self-pay | Admitting: Cardiology

## 2013-04-14 ENCOUNTER — Other Ambulatory Visit: Payer: Self-pay | Admitting: Cardiology

## 2013-05-10 ENCOUNTER — Other Ambulatory Visit: Payer: Self-pay

## 2013-05-10 MED ORDER — MEMANTINE HCL 10 MG PO TABS: ORAL_TABLET | ORAL | Status: DC

## 2013-06-08 ENCOUNTER — Telehealth: Payer: Self-pay | Admitting: *Deleted

## 2013-06-08 ENCOUNTER — Other Ambulatory Visit: Payer: Self-pay | Admitting: Cardiology

## 2013-06-08 NOTE — Telephone Encounter (Signed)
Monday 06/20/13 at 3:00pm. Requested that she call back to confirm if she could make it or not, and that upon confirmation I would refill her meds up until that date only.

## 2013-06-10 ENCOUNTER — Ambulatory Visit: Payer: Medicare Other | Admitting: Cardiology

## 2013-06-10 ENCOUNTER — Other Ambulatory Visit: Payer: Self-pay | Admitting: Cardiology

## 2013-06-13 ENCOUNTER — Telehealth: Payer: Self-pay | Admitting: Cardiology

## 2013-06-13 ENCOUNTER — Encounter (HOSPITAL_COMMUNITY): Payer: Self-pay | Admitting: Emergency Medicine

## 2013-06-13 DIAGNOSIS — G9341 Metabolic encephalopathy: Secondary | ICD-10-CM | POA: Diagnosis not present

## 2013-06-13 DIAGNOSIS — A498 Other bacterial infections of unspecified site: Secondary | ICD-10-CM | POA: Diagnosis present

## 2013-06-13 DIAGNOSIS — H9190 Unspecified hearing loss, unspecified ear: Secondary | ICD-10-CM | POA: Diagnosis present

## 2013-06-13 DIAGNOSIS — I10 Essential (primary) hypertension: Secondary | ICD-10-CM | POA: Diagnosis present

## 2013-06-13 DIAGNOSIS — Z85828 Personal history of other malignant neoplasm of skin: Secondary | ICD-10-CM

## 2013-06-13 DIAGNOSIS — N179 Acute kidney failure, unspecified: Principal | ICD-10-CM | POA: Diagnosis present

## 2013-06-13 DIAGNOSIS — IMO0002 Reserved for concepts with insufficient information to code with codable children: Secondary | ICD-10-CM

## 2013-06-13 DIAGNOSIS — H04129 Dry eye syndrome of unspecified lacrimal gland: Secondary | ICD-10-CM | POA: Diagnosis present

## 2013-06-13 DIAGNOSIS — E785 Hyperlipidemia, unspecified: Secondary | ICD-10-CM | POA: Diagnosis present

## 2013-06-13 DIAGNOSIS — Z9181 History of falling: Secondary | ICD-10-CM

## 2013-06-13 DIAGNOSIS — Z66 Do not resuscitate: Secondary | ICD-10-CM | POA: Diagnosis present

## 2013-06-13 DIAGNOSIS — F0150 Vascular dementia without behavioral disturbance: Secondary | ICD-10-CM | POA: Diagnosis present

## 2013-06-13 DIAGNOSIS — Z794 Long term (current) use of insulin: Secondary | ICD-10-CM

## 2013-06-13 DIAGNOSIS — N8111 Cystocele, midline: Secondary | ICD-10-CM | POA: Diagnosis present

## 2013-06-13 DIAGNOSIS — I509 Heart failure, unspecified: Secondary | ICD-10-CM | POA: Diagnosis present

## 2013-06-13 DIAGNOSIS — F05 Delirium due to known physiological condition: Secondary | ICD-10-CM | POA: Diagnosis present

## 2013-06-13 DIAGNOSIS — I2589 Other forms of chronic ischemic heart disease: Secondary | ICD-10-CM | POA: Diagnosis present

## 2013-06-13 DIAGNOSIS — N390 Urinary tract infection, site not specified: Secondary | ICD-10-CM | POA: Diagnosis present

## 2013-06-13 DIAGNOSIS — Z79899 Other long term (current) drug therapy: Secondary | ICD-10-CM

## 2013-06-13 DIAGNOSIS — Z951 Presence of aortocoronary bypass graft: Secondary | ICD-10-CM

## 2013-06-13 DIAGNOSIS — E119 Type 2 diabetes mellitus without complications: Secondary | ICD-10-CM | POA: Diagnosis present

## 2013-06-13 DIAGNOSIS — I672 Cerebral atherosclerosis: Secondary | ICD-10-CM | POA: Diagnosis present

## 2013-06-13 DIAGNOSIS — E860 Dehydration: Secondary | ICD-10-CM | POA: Diagnosis present

## 2013-06-13 DIAGNOSIS — R627 Adult failure to thrive: Secondary | ICD-10-CM | POA: Diagnosis present

## 2013-06-13 DIAGNOSIS — Z9861 Coronary angioplasty status: Secondary | ICD-10-CM

## 2013-06-13 DIAGNOSIS — R32 Unspecified urinary incontinence: Secondary | ICD-10-CM | POA: Diagnosis present

## 2013-06-13 LAB — CBC WITH DIFFERENTIAL/PLATELET
Basophils Absolute: 0 10*3/uL (ref 0.0–0.1)
Basophils Relative: 0 % (ref 0–1)
HCT: 37.7 % (ref 36.0–46.0)
MCHC: 34.2 g/dL (ref 30.0–36.0)
Monocytes Absolute: 1 10*3/uL (ref 0.1–1.0)
Neutro Abs: 5.9 10*3/uL (ref 1.7–7.7)
Platelets: 344 10*3/uL (ref 150–400)
RDW: 14.7 % (ref 11.5–15.5)

## 2013-06-13 LAB — COMPREHENSIVE METABOLIC PANEL
AST: 27 U/L (ref 0–37)
Albumin: 3.8 g/dL (ref 3.5–5.2)
Calcium: 10.5 mg/dL (ref 8.4–10.5)
Chloride: 104 mEq/L (ref 96–112)
Creatinine, Ser: 1.37 mg/dL — ABNORMAL HIGH (ref 0.50–1.10)
Total Bilirubin: 0.3 mg/dL (ref 0.3–1.2)

## 2013-06-13 NOTE — Telephone Encounter (Signed)
Follow Up   2nd call   Pt is having high bp and low pulse. Pt's daughter hasn't been able to get it stabilized with bp medication or water pill. Please call pt's daughter Radene Knee.

## 2013-06-13 NOTE — Telephone Encounter (Signed)
Spoke with patient's daughter who states the patient has been having blood pressure problems since 8/15.  Daughter states BP is elevated and heart rate is low.  BPs are 164/62, 140/100, 158/80; pulse rate in the 40's and 50's.  Daughter states patient "does not look right and has a bluish tint that has started in her hands and is spreading."  I questioned the patient's mental status and respiratory status and daughter states that patient is alert and participates in conversation most of the time but that patient does fall asleep when her BP is elevated. I advised daughter that given the discoloration, patient should go to the hospital.  Daughter refused, said patient would have to go to the [facility] in [place] and daughter doesn't want to do that.  Patient's daughter would like patient seen in the office tomorrow.  I advised daughter that Dr. Patty Sermons is not here today or tomorrow and the earliest he could see her would be Wednesday.  I discussed patient with Regis Bill, LPN, Dr. Yevonne Pax primary nurse who agreed with my advice.  I informed daughter that both Juliette Alcide and I agree that patient should go to the hospital.  Patient's daughter refused and said she felt like the patient just needed modification of BP meds.  Daughter states if patient's condition deteriorates she will take her to hospital in [facility] or [place]. I advised daughter that I would discuss again with Regis Bill, LPN and that Juliette Alcide would call her back regarding an appointment with Dr. Patty Sermons.  Daughter verbalized understanding and agreement with plan of care.

## 2013-06-13 NOTE — Telephone Encounter (Signed)
Agree with advice given to be checked in ER

## 2013-06-13 NOTE — Telephone Encounter (Signed)
New problem   Pt is having high bp and low pulse. Pt's daughter hasn't been able to get it stabilized with bp medication or water pill. Please call pt's daughter Radene Knee

## 2013-06-13 NOTE — ED Notes (Signed)
Daughter reports pt.'s face / eyelids  are swelling , fingers and toes are cold and blue with irregular heart rate and blood pressure for the past several days.

## 2013-06-13 NOTE — Telephone Encounter (Signed)
Daughter still refusing to take her at this point to ED, strongly recommended. States she will think about it, did schedule ov 06/15/13.

## 2013-06-14 ENCOUNTER — Inpatient Hospital Stay (HOSPITAL_COMMUNITY)
Admission: EM | Admit: 2013-06-14 | Discharge: 2013-06-15 | DRG: 682 | Disposition: A | Discharge status: Home / Self Care | Payer: Medicare Other | Attending: Family Medicine | Admitting: Family Medicine

## 2013-06-14 ENCOUNTER — Encounter (HOSPITAL_COMMUNITY): Payer: Self-pay | Admitting: Emergency Medicine

## 2013-06-14 ENCOUNTER — Inpatient Hospital Stay (HOSPITAL_COMMUNITY): Payer: Medicare Other

## 2013-06-14 ENCOUNTER — Emergency Department (HOSPITAL_COMMUNITY): Payer: Medicare Other

## 2013-06-14 DIAGNOSIS — F0150 Vascular dementia without behavioral disturbance: Secondary | ICD-10-CM | POA: Diagnosis present

## 2013-06-14 DIAGNOSIS — E876 Hypokalemia: Secondary | ICD-10-CM

## 2013-06-14 DIAGNOSIS — N390 Urinary tract infection, site not specified: Secondary | ICD-10-CM | POA: Diagnosis present

## 2013-06-14 DIAGNOSIS — F0151 Vascular dementia with behavioral disturbance: Secondary | ICD-10-CM

## 2013-06-14 DIAGNOSIS — I25810 Atherosclerosis of coronary artery bypass graft(s) without angina pectoris: Secondary | ICD-10-CM

## 2013-06-14 DIAGNOSIS — E870 Hyperosmolality and hypernatremia: Secondary | ICD-10-CM

## 2013-06-14 DIAGNOSIS — N179 Acute kidney failure, unspecified: Secondary | ICD-10-CM | POA: Diagnosis present

## 2013-06-14 DIAGNOSIS — E860 Dehydration: Secondary | ICD-10-CM

## 2013-06-14 DIAGNOSIS — R2681 Unsteadiness on feet: Secondary | ICD-10-CM

## 2013-06-14 DIAGNOSIS — D649 Anemia, unspecified: Secondary | ICD-10-CM

## 2013-06-14 DIAGNOSIS — G9341 Metabolic encephalopathy: Secondary | ICD-10-CM | POA: Diagnosis present

## 2013-06-14 DIAGNOSIS — I10 Essential (primary) hypertension: Secondary | ICD-10-CM

## 2013-06-14 DIAGNOSIS — E119 Type 2 diabetes mellitus without complications: Secondary | ICD-10-CM

## 2013-06-14 LAB — URINALYSIS, ROUTINE W REFLEX MICROSCOPIC
Glucose, UA: NEGATIVE mg/dL
Hgb urine dipstick: NEGATIVE
Specific Gravity, Urine: 1.009 (ref 1.005–1.030)

## 2013-06-14 LAB — URINE MICROSCOPIC-ADD ON

## 2013-06-14 LAB — GLUCOSE, CAPILLARY
Glucose-Capillary: 141 mg/dL — ABNORMAL HIGH (ref 70–99)
Glucose-Capillary: 248 mg/dL — ABNORMAL HIGH (ref 70–99)

## 2013-06-14 MED ORDER — INSULIN ASPART 100 UNIT/ML ~~LOC~~ SOLN
1.0000 [IU] | Freq: Three times a day (TID) | SUBCUTANEOUS | Status: DC
  Administered 2013-06-14: 1 [IU] via SUBCUTANEOUS
  Administered 2013-06-15: 2 [IU] via SUBCUTANEOUS
  Administered 2013-06-15: 1 [IU] via SUBCUTANEOUS

## 2013-06-14 MED ORDER — HALOPERIDOL 0.5 MG PO TABS
0.5000 mg | ORAL_TABLET | Freq: Four times a day (QID) | ORAL | Status: DC | PRN
  Filled 2013-06-14: qty 2

## 2013-06-14 MED ORDER — LABETALOL HCL 5 MG/ML IV SOLN: 10.0000 mg | INTRAVENOUS | Status: DC | PRN

## 2013-06-14 MED ORDER — DEXTROSE 5 % IV SOLN
1.0000 g | INTRAVENOUS | Status: DC
  Administered 2013-06-14 – 2013-06-15 (×2): 1 g via INTRAVENOUS
  Filled 2013-06-14 (×2): qty 10

## 2013-06-14 MED ORDER — HEPARIN SODIUM (PORCINE) 5000 UNIT/ML IJ SOLN
5000.0000 [IU] | Freq: Three times a day (TID) | INTRAMUSCULAR | Status: DC
  Administered 2013-06-14 – 2013-06-15 (×5): 5000 [IU] via SUBCUTANEOUS
  Filled 2013-06-14 (×7): qty 1

## 2013-06-14 MED ORDER — MEMANTINE HCL 10 MG PO TABS
10.0000 mg | ORAL_TABLET | Freq: Two times a day (BID) | ORAL | Status: DC
  Administered 2013-06-14 – 2013-06-15 (×3): 10 mg via ORAL
  Filled 2013-06-14 (×4): qty 1

## 2013-06-14 MED ORDER — HALOPERIDOL LACTATE 5 MG/ML IJ SOLN: 0.5000 mg | Freq: Four times a day (QID) | INTRAMUSCULAR | Status: DC | PRN

## 2013-06-14 MED ORDER — ARTIFICIAL TEARS OP OINT
TOPICAL_OINTMENT | OPHTHALMIC | Status: DC | PRN
  Filled 2013-06-14: qty 3.5

## 2013-06-14 MED ORDER — PANTOPRAZOLE SODIUM 40 MG PO TBEC
40.0000 mg | DELAYED_RELEASE_TABLET | Freq: Every day | ORAL | Status: DC
  Administered 2013-06-14 – 2013-06-15 (×2): 40 mg via ORAL
  Filled 2013-06-14 (×2): qty 1

## 2013-06-14 MED ORDER — INSULIN ASPART 100 UNIT/ML ~~LOC~~ SOLN: 0.0000 [IU] | Freq: Three times a day (TID) | SUBCUTANEOUS | Status: DC

## 2013-06-14 MED ORDER — NITROGLYCERIN 0.6 MG/HR TD PT24
1.2000 mg | MEDICATED_PATCH | Freq: Every day | TRANSDERMAL | Status: DC
  Filled 2013-06-14 (×2): qty 2

## 2013-06-14 MED ORDER — FENOFIBRATE 160 MG PO TABS
160.0000 mg | ORAL_TABLET | Freq: Every day | ORAL | Status: DC
  Administered 2013-06-14 – 2013-06-15 (×2): 160 mg via ORAL
  Filled 2013-06-14 (×2): qty 1

## 2013-06-14 MED ORDER — SODIUM CHLORIDE 0.9 % IV SOLN
INTRAVENOUS | Status: DC
  Administered 2013-06-14 – 2013-06-15 (×3): via INTRAVENOUS

## 2013-06-14 MED ORDER — EZETIMIBE 10 MG PO TABS
10.0000 mg | ORAL_TABLET | Freq: Every day | ORAL | Status: DC
  Administered 2013-06-14 – 2013-06-15 (×2): 10 mg via ORAL
  Filled 2013-06-14 (×2): qty 1

## 2013-06-14 MED ORDER — METOPROLOL SUCCINATE ER 25 MG PO TB24
25.0000 mg | ORAL_TABLET | Freq: Every day | ORAL | Status: DC
  Administered 2013-06-14: 25 mg via ORAL
  Filled 2013-06-14 (×2): qty 1

## 2013-06-14 MED ORDER — INSULIN GLARGINE 100 UNIT/ML ~~LOC~~ SOLN
8.0000 [IU] | Freq: Every morning | SUBCUTANEOUS | Status: DC
  Administered 2013-06-14 – 2013-06-15 (×2): 8 [IU] via SUBCUTANEOUS
  Filled 2013-06-14 (×2): qty 0.08

## 2013-06-14 MED ORDER — LORAZEPAM 1 MG PO TABS
1.0000 mg | ORAL_TABLET | Freq: Every day | ORAL | Status: DC | PRN
  Administered 2013-06-14: 1 mg via ORAL
  Filled 2013-06-14: qty 1

## 2013-06-14 MED ORDER — SODIUM CHLORIDE 0.9 % IV BOLUS (SEPSIS)
500.0000 mL | Freq: Once | INTRAVENOUS | Status: AC
  Administered 2013-06-14: 500 mL via INTRAVENOUS

## 2013-06-14 NOTE — Progress Notes (Signed)
UR COMPLETED  
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
## 2013-06-14 NOTE — ED Provider Notes (Signed)
CSN: [id number]     Arrival date & time 06/13/13  2128 History   First MD Initiated Contact with Patient 06/14/13 0222     Chief Complaint  Patient presents with  . Irregular Heart Beat  . Facial Swelling   (Consider location/radiation/quality/duration/timing/severity/associated sxs/prior Treatment) HPI Comments: Patient is a 79 year old female with past medical history significant for diabetes, hypertension, ischemic heart disease. She is brought by her daughter who is her primary caregiver for evaluation of weakness and decreased energy over the past several days. She denies any fevers. Patient is very hard of hearing and has a history of dementia therefore the history is taken primarily through the daughter who was present at bedside.  The history is provided by the patient.    Past Medical History  Diagnosis Date  . Angina   . Anemia   . Diabetes mellitus   . Complication of anesthesia     ultra sensitive  . Dementia   . Ischemic heart disease     remote CABG in 1992 with PCT and stent in 2000 while in [place]  . HLD (hyperlipidemia)   . HTN (hypertension)   . Carotid bruit     bilateral   Past Surgical History  Procedure Laterality Date  . Coronary artery bypass graft      1984  . Coronary stents      4 all together  . Colon surgery  approx. 1992    colon tumor benign  . Skin cancer excision      face - squamous cell  . Nasal reconstruction      rebuilt due to squamous cell ca  . Femur im nail  09/16/2011    Procedure: INTRAMEDULLARY (IM) NAIL FEMORAL;  Surgeon: Toni Arthurs, MD;  Location: [facility];  Service: Orthopedics;  Laterality: Right;   Family History  Problem Relation Age of Onset  . Coronary artery disease     History  Substance Use Topics  . Smoking status: Never Smoker   . Smokeless tobacco: Never Used  . Alcohol Use: No   OB History   Grav Para Term Preterm Abortions TAB SAB Ect Mult Living                 Review of Systems  All other systems  reviewed and are negative.    Allergies  Review of patient's allergies indicates no known allergies.  Home Medications   Current Outpatient Rx  Name  Route  Sig  Dispense  Refill  . B Complex Vitamins (VITAMIN B COMPLEX PO)   Oral   Take 1 tablet by mouth daily.           Marland Kitchen BIOTIN PO   Oral   Take 1,000 mcg by mouth daily.          . Cholecalciferol (D3 ADULT PO)   Oral   Take 2,000 mg by mouth daily.          . Coenzyme Q10 (COQ10 PO)   Oral   Take 1 tablet by mouth daily.           Marland Kitchen ezetimibe (ZETIA) 10 MG tablet   Oral   Take 10 mg by mouth daily.         . fenofibrate 160 MG tablet   Oral   Take 160 mg by mouth daily.         . furosemide (LASIX) 20 MG tablet   Oral   Take 10 mg by mouth daily.         Marland Kitchen  insulin aspart (NOVOLOG) 100 UNIT/ML injection   Subcutaneous   Inject 1-5 Units into the skin 3 (three) times daily with meals.         . insulin glargine (LANTUS) 100 UNIT/ML injection   Subcutaneous   Inject 8 Units into the skin every morning.          Marland Kitchen lisinopril (PRINIVIL,ZESTRIL) 10 MG tablet   Oral   Take 10 mg by mouth daily.          Marland Kitchen LORazepam (ATIVAN) 1 MG tablet   Oral   Take 1 tablet (1 mg total) by mouth daily as needed for anxiety.   90 tablet   0     Call office for appt   . memantine (NAMENDA) 10 MG tablet   Oral   Take 10 mg by mouth 2 (two) times daily.         . metoprolol succinate (TOPROL-XL) 25 MG 24 hr tablet   Oral   Take 25 mg by mouth daily.         . nitroGLYCERIN (NITRODUR - DOSED IN MG/24 HR) 0.4 mg/hr   Transdermal   Place 3 patches (1.2 mg total) onto the skin daily.   30 patch      . nitroGLYCERIN (NITROSTAT) 0.4 MG SL tablet   Sublingual   Place 0.4 mg under the tongue every 5 (five) minutes x 3 doses as needed. For chest pain.          Marland Kitchen omeprazole (PRILOSEC) 20 MG capsule   Oral   Take 20 mg by mouth daily.           Marland Kitchen OVER THE COUNTER MEDICATION   Oral   Take 1  tablet by mouth every evening. Salus Floradix         . OVER THE COUNTER MEDICATION   Oral   Take 30 mLs by mouth every morning. Salus Floradix          BP 184/60  Pulse 53  Temp(Src) 98.4 F (36.9 C) (Oral)  Resp 16  SpO2 97% Physical Exam  Nursing note and vitals reviewed. Constitutional: She is oriented to person, place, and time. She appears well-developed and well-nourished. No distress.  HENT:  Head: Normocephalic and atraumatic.  Mouth/Throat: Oropharynx is clear and moist.  Eyes: EOM are normal. Pupils are equal, round, and reactive to light.  Neck: Normal range of motion. Neck supple.  Cardiovascular: Normal rate, regular rhythm and normal heart sounds.   No murmur heard. Pulmonary/Chest: Effort normal and breath sounds normal. No respiratory distress. She has no wheezes.  Abdominal: Soft. Bowel sounds are normal. She exhibits no distension. There is no tenderness.  Musculoskeletal: Normal range of motion. She exhibits no edema.  Neurological: She is alert and oriented to person, place, and time.  Skin: Skin is warm and dry. She is not diaphoretic.    ED Course  Procedures (including critical care time) Labs Review Labs Reviewed  CBC WITH DIFFERENTIAL - Abnormal; Notable for the following:    WBC 11.0 (*)    All other components within normal limits  COMPREHENSIVE METABOLIC PANEL - Abnormal; Notable for the following:    Glucose, Bld 135 (*)    BUN 61 (*)    Creatinine, Ser 1.37 (*)    GFR calc non Af Amer 32 (*)    GFR calc Af Amer 37 (*)    All other components within normal limits  URINALYSIS, ROUTINE W REFLEX MICROSCOPIC   Imaging  Review No results found.   Date: 06/14/2013  Rate: 73  Rhythm: normal sinus rhythm  QRS Axis: normal  Intervals: PR prolonged  ST/T Wave abnormalities: normal  Conduction Disutrbances:first-degree A-V block   Narrative Interpretation:   Old EKG Reviewed: unchanged    MDM  No diagnosis found. This patient  presents with weakness, flushing of the face, decreased appetite, and failure to thrive which has been worsening over the past several days. Workup reveals an elevated BUN and creatinine consistent with dehydration. She is given normal saline. She also has a slight elevation of her white count however chest x-ray is unremarkable. She will be admitted to the hospitalist service for hydration and further evaluation.    Geoffery Lyons, MD 06/14/13 0630

## 2013-06-14 NOTE — H&P (Signed)
[facility] History and Physical  Donnalynn Wheeless WUJ:[id number] DOB: [birth year] DOA: 06/14/2013  Referring physician: ED PCP: Drucie Opitz, MD   Chief Complaint: Generalized weakness  HPI: Shelby Burns is a 79 y.o. female with PMH of vascular dementia, ischemic cardiomyopathy, CHF, who is brought in by her daughter who is her primary caregiver for generalized weakness, decreased appetite, and "changes in color" as well.  Daughter notes that the patients complexion has been looking more blue recently and finally today she is looking "green" about the face.  Patient is very hard of hearing and has severe dementia so daughter is primary source of history.  Work up in the ED did reveal AKI with a pre-renal pattern BUN of 61 and creatinine of 137.  UA unable to be obtained at this time due to patient being incontinent of urine and having a severe cystocele (RN attempting foley but per daughter they have been unable to get this in the past.  Review of Systems: Unable to perform due to dementia.  Past Medical History  Diagnosis Date  . Angina   . Anemia   . Diabetes mellitus   . Complication of anesthesia     ultra sensitive  . Dementia   . Ischemic heart disease     remote CABG in 1992 with PCT and stent in 2000 while in [place]  . HLD (hyperlipidemia)   . HTN (hypertension)   . Carotid bruit     bilateral   Past Surgical History  Procedure Laterality Date  . Coronary artery bypass graft      1984  . Coronary stents      4 all together  . Colon surgery  approx. 1992    colon tumor benign  . Skin cancer excision      face - squamous cell  . Nasal reconstruction      rebuilt due to squamous cell ca  . Femur im nail  09/16/2011    Procedure: INTRAMEDULLARY (IM) NAIL FEMORAL;  Surgeon: Toni Arthurs, MD;  Location: [facility];  Service: Orthopedics;  Laterality: Right;   Social History:  reports that she has never smoked. She has never used smokeless tobacco. She reports that  she does not drink alcohol or use illicit drugs.   No Known Allergies  Family History  Problem Relation Age of Onset  . Coronary artery disease      Prior to Admission medications   Medication Sig Start Date End Date Taking? Authorizing Provider  B Complex Vitamins (VITAMIN B COMPLEX PO) Take 1 tablet by mouth daily.     Yes Historical Provider, MD  BIOTIN PO Take 1,000 mcg by mouth daily.    Yes Historical Provider, MD  Cholecalciferol (D3 ADULT PO) Take 2,000 mg by mouth daily.    Yes Historical Provider, MD  Coenzyme Q10 (COQ10 PO) Take 1 tablet by mouth daily.     Yes Historical Provider, MD  ezetimibe (ZETIA) 10 MG tablet Take 10 mg by mouth daily.   Yes Historical Provider, MD  fenofibrate 160 MG tablet Take 160 mg by mouth daily.   Yes Historical Provider, MD  furosemide (LASIX) 20 MG tablet Take 10 mg by mouth daily.   Yes Historical Provider, MD  insulin aspart (NOVOLOG) 100 UNIT/ML injection Inject 1-5 Units into the skin 3 (three) times daily with meals.   Yes Historical Provider, MD  insulin glargine (LANTUS) 100 UNIT/ML injection Inject 8 Units into the skin every morning.    Yes Historical Provider, MD  lisinopril (PRINIVIL,ZESTRIL) 10 MG tablet Take 10 mg by mouth daily.  05/13/13  Yes Historical Provider, MD  LORazepam (ATIVAN) 1 MG tablet Take 1 tablet (1 mg total) by mouth daily as needed for anxiety. 10/28/12  Yes Cassell Clement, MD  memantine (NAMENDA) 10 MG tablet Take 10 mg by mouth 2 (two) times daily.   Yes Historical Provider, MD  metoprolol succinate (TOPROL-XL) 25 MG 24 hr tablet Take 25 mg by mouth daily.   Yes Historical Provider, MD  nitroGLYCERIN (NITRODUR - DOSED IN MG/24 HR) 0.4 mg/hr Place 3 patches (1.2 mg total) onto the skin daily. 09/22/11  Yes Maryruth Bun Rama, MD  nitroGLYCERIN (NITROSTAT) 0.4 MG SL tablet Place 0.4 mg under the tongue every 5 (five) minutes x 3 doses as needed. For chest pain.    Yes Historical Provider, MD  omeprazole (PRILOSEC) 20  MG capsule Take 20 mg by mouth daily.     Yes Historical Provider, MD  OVER THE COUNTER MEDICATION Take 1 tablet by mouth every evening. Salus Floradix   Yes Historical Provider, MD  OVER THE COUNTER MEDICATION Take 30 mLs by mouth every morning. Salus Floradix   Yes Historical Provider, MD   Physical Exam: Filed Vitals:   06/14/13 0500  BP: 122/83  Pulse: 56  Temp:   Resp: 22    General:  NAD, resting comfortably in bed Eyes: PEERLA EOMI ENT: mucous membranes moist Neck: supple w/o JVD Cardiovascular: RRR w/o MRG Respiratory: CTA B Abdomen: soft, nt, nd, bs+ Skin: no rash nor lesion Musculoskeletal: MAE, full ROM all 4 extremities Psychiatric: demented Neurologic: demented, appears to be grossly intact  Labs on Admission:  Basic Metabolic Panel:  Recent Labs Lab 06/13/13 2148  NA 138  K 4.7  CL 104  CO2 25  GLUCOSE 135*  BUN 61*  CREATININE 1.37*  CALCIUM 10.5   Liver Function Tests:  Recent Labs Lab 06/13/13 2148  AST 27  ALT 16  ALKPHOS 74  BILITOT 0.3  PROT 8.0  ALBUMIN 3.8   No results found for this basename: LIPASE, AMYLASE,  in the last 168 hours No results found for this basename: AMMONIA,  in the last 168 hours CBC:  Recent Labs Lab 06/13/13 2148  WBC 11.0*  NEUTROABS 5.9  HGB 12.9  HCT 37.7  MCV 89.1  PLT 344   Cardiac Enzymes: No results found for this basename: CKTOTAL, CKMB, CKMBINDEX, TROPONINI,  in the last 168 hours  BNP (last 3 results) No results found for this basename: PROBNP,  in the last 8760 hours CBG: No results found for this basename: GLUCAP,  in the last 168 hours  Radiological Exams on Admission: Dg Chest 2 View  06/14/2013   CLINICAL DATA:  Irregular heartbeat  EXAM: CHEST  2 VIEW  COMPARISON:  09/15/2011  FINDINGS: Chronic cardiomegaly. Status post CABG. Aortic arch atherosclerosis.  Chronic interstitial coarsening. There is an asymmetric retrocardiac opacity, also likely present previously. No significant  effusion. No pneumothorax.  L1 compression fracture with greater than 50% height loss but no subluxation. No prior imaging at this level since 2006.  IMPRESSION: 1. Chronic lung disease, similar to 09/15/2011. These changes could obscure an acute infiltrate, especially at the left base. 2. Chronic cardiomegaly. Status post CABG. 3. Age indeterminate L1 compression fracture.   Electronically Signed   By: Tiburcio Pea   On: 06/14/2013 03:46    EKG: Independently reviewed.  Assessment/Plan Principal Problem:   ARF (acute renal failure) Active Problems:  DM type 2 (diabetes mellitus, type 2)   Dementia, vascular with delirium   1. AKI - strong suspicion of dehydration as the primary cause with reduced PO intake and pre-renal pattern on BMP.  Patient received 1L ivf in ED, have ordered intake and output thought his may be difficult to obtain, UA also ordered but again may be difficult to obtain.  IVF at 50 cc/hr and holding lisinopril and lasix as nephrotoxic at this point.  Gentle hydration as patient has known h/o CHF. 2. DM2 - continue home lantus and SSI insulin 3. Dementia - chronic and stable, PRN ativan for agitation / aggression    Code Status: DNR (must indicate code status--if unknown or must be presumed, indicate so) Family Communication: Spoke with daughter at bedside (indicate person spoken with, if applicable, with phone number if by telephone) Disposition Plan: Admit to inpatient (indicate anticipated LOS)  Time spent: 70 min  GARDNER, JARED M. [facility] Pager [phone]  If 7PM-7AM, please contact night-coverage [email or web address] Password [facility] 06/14/2013, 5:55 AM

## 2013-06-14 NOTE — Plan of Care (Signed)
Dr short paged in regards to pt urine output. Waiting for md to call back.

## 2013-06-15 ENCOUNTER — Ambulatory Visit: Payer: Medicare Other | Admitting: Cardiology

## 2013-06-15 DIAGNOSIS — N390 Urinary tract infection, site not specified: Secondary | ICD-10-CM

## 2013-06-15 DIAGNOSIS — I10 Essential (primary) hypertension: Secondary | ICD-10-CM

## 2013-06-15 DIAGNOSIS — E860 Dehydration: Secondary | ICD-10-CM | POA: Diagnosis present

## 2013-06-15 LAB — CBC
HCT: 28.8 % — ABNORMAL LOW (ref 36.0–46.0)
Hemoglobin: 9.8 g/dL — ABNORMAL LOW (ref 12.0–15.0)
RBC: 3.22 MIL/uL — ABNORMAL LOW (ref 3.87–5.11)
WBC: 8.2 10*3/uL (ref 4.0–10.5)

## 2013-06-15 LAB — BASIC METABOLIC PANEL
BUN: 46 mg/dL — ABNORMAL HIGH (ref 6–23)
Chloride: 110 mEq/L (ref 96–112)
Glucose, Bld: 106 mg/dL — ABNORMAL HIGH (ref 70–99)
Potassium: 4 mEq/L (ref 3.5–5.1)

## 2013-06-15 LAB — GLUCOSE, CAPILLARY
Glucose-Capillary: 85 mg/dL (ref 70–99)
Glucose-Capillary: 206 mg/dL — ABNORMAL HIGH (ref 70–99)

## 2013-06-15 MED ORDER — LEVOFLOXACIN 250 MG PO TABS: ORAL_TABLET | ORAL | Status: DC
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
## 2013-06-15 NOTE — Progress Notes (Signed)
Nutrition Brief Note  Malnutrition Screening Tool result is inaccurate.  Please consult if nutrition needs are identified.  Katie Aaryn Parrilla, RD, LDN Pager #: [phone] After-Hours Pager #: [phone]  

## 2013-06-16 LAB — URINE CULTURE

## 2013-06-20 ENCOUNTER — Encounter: Payer: Self-pay | Admitting: Cardiology

## 2013-06-20 ENCOUNTER — Ambulatory Visit (INDEPENDENT_AMBULATORY_CARE_PROVIDER_SITE_OTHER): Payer: Medicare Other | Admitting: Cardiology

## 2013-06-20 VITALS — BP 120/52 | HR 63 | Ht 61.0 in | Wt 110.0 lb

## 2013-06-20 DIAGNOSIS — N289 Disorder of kidney and ureter, unspecified: Secondary | ICD-10-CM

## 2013-06-20 DIAGNOSIS — I10 Essential (primary) hypertension: Secondary | ICD-10-CM

## 2013-06-20 DIAGNOSIS — I259 Chronic ischemic heart disease, unspecified: Secondary | ICD-10-CM

## 2013-06-20 DIAGNOSIS — R0602 Shortness of breath: Secondary | ICD-10-CM

## 2013-06-20 DIAGNOSIS — N183 Chronic kidney disease, stage 3 unspecified: Secondary | ICD-10-CM

## 2013-06-20 DIAGNOSIS — I25810 Atherosclerosis of coronary artery bypass graft(s) without angina pectoris: Secondary | ICD-10-CM

## 2013-06-20 MED ORDER — LISINOPRIL 10 MG PO TABS: 10.0000 mg | ORAL_TABLET | Freq: Every day | ORAL | Status: DC

## 2013-06-20 NOTE — Assessment & Plan Note (Signed)
The patient has well-documented prior coronary artery disease.  She has not been having any recent chest discomfort

## 2013-06-20 NOTE — Assessment & Plan Note (Signed)
Previously the patient had been doing well on low dose lisinopril 10 mg daily which her previous physician Dr. Drucie Opitz had prescribed.  We will restart her lisinopril 10 mg daily.  We are checking a basal metabolic panel today as well as a B. natruretic peptide

## 2013-06-20 NOTE — Patient Instructions (Addendum)
Will obtain labs today and call you with the results (bmet/bnp)  Your physician recommends that you continue on your current medications as directed. Please refer to the Current Medication list given to you today.  Follow up as needed

## 2013-06-20 NOTE — Assessment & Plan Note (Signed)
We will restart her lisinopril but we will not restart her Lasix.  She does have mild ankle edema consistent with venous insufficiency.

## 2013-06-20 NOTE — Progress Notes (Signed)
Dalphine Handing Date of Birth:  1917-07-03 [facility] [address] [place], [place]  [address] [phone]         Fax   [phone]  History of Present Illness: This 79 year old Caucasian female is seen after a several year absence.  She gets most of her medical care at the [facility].  She has a history of known ischemic heart disease.  She had coronary artery bypass graft surgery in 1984 since then she has had several angioplasties.  All these occurred prior to her moving here from [place] many years ago.  Her last echocardiogram done at [facility] cardiology on 05/11/06 showed normal left ventricular systolic function with an ejection fraction of 55-60%.  There was grade 1 diastolic dysfunction.  There was moderate aortic stenosis and aortic insufficiency and mild mitral stenosis and mild mitral regurgitation. The patient has a history of essential hypertension.  She was recently admitted to [facility] from 06/14/13 until 06/15/13 for acute renal failure secondary to prerenal dehydration secondary to Lasix.  She was also noted to have metabolic encephalopathy and dementia as well as diabetes mellitus type 2 and a urinary tract infection. The family employee's sitters for the patient at home.  The patient's daughter who accompanied her today brought in a list of her blood pressure readings from home.  There were occasional a significant elevated readings.  Because of the patient's dementia she is not complaining of any problem or symptoms at this time.  Current Outpatient Prescriptions  Medication Sig Dispense Refill  . B Complex Vitamins (VITAMIN B COMPLEX PO) Take 1 tablet by mouth daily.        Marland Kitchen BIOTIN PO Take 1,000 mcg by mouth daily.       . Cholecalciferol (D3 ADULT PO) Take 2,000 mg by mouth daily.       . Coenzyme Q10 (COQ10 PO) Take 1 tablet by mouth daily.        Marland Kitchen ezetimibe (ZETIA) 10 MG tablet Take 10 mg by mouth daily.      . fenofibrate 160  MG tablet Take 160 mg by mouth daily.      . insulin aspart (NOVOLOG) 100 UNIT/ML injection Inject 1-5 Units into the skin 3 (three) times daily with meals.      . insulin glargine (LANTUS) 100 UNIT/ML injection Inject 8 Units into the skin every morning.       Marland Kitchen levofloxacin (LEVAQUIN) 250 MG tablet Take 2 tabs po on day 1 (9/18), then take 1 tab po daily until completed  8 tablet  0  . LORazepam (ATIVAN) 1 MG tablet Take 1 tablet (1 mg total) by mouth daily as needed for anxiety.  90 tablet  0  . memantine (NAMENDA) 10 MG tablet Take 10 mg by mouth 2 (two) times daily.      . metoprolol succinate (TOPROL-XL) 25 MG 24 hr tablet Take 25 mg by mouth daily.      . nitroGLYCERIN (NITRODUR - DOSED IN MG/24 HR) 0.4 mg/hr Place 3 patches (1.2 mg total) onto the skin daily.  30 patch    . nitroGLYCERIN (NITROSTAT) 0.4 MG SL tablet Place 0.4 mg under the tongue every 5 (five) minutes x 3 doses as needed. For chest pain.       Marland Kitchen omeprazole (PRILOSEC) 20 MG capsule Take 20 mg by mouth daily.        Marland Kitchen OVER THE COUNTER MEDICATION Take 1 tablet by mouth every evening. Salus Floradix      .  OVER THE COUNTER MEDICATION Take 30 mLs by mouth every morning. Salus Floradix      . lisinopril (PRINIVIL,ZESTRIL) 10 MG tablet Take 1 tablet (10 mg total) by mouth daily.  90 tablet  3   No current facility-administered medications for this visit.    No Known Allergies  Patient Active Problem List   Diagnosis Date Noted  . Dehydration 06/15/2013  . UTI (urinary tract infection) 06/14/2013  . Encephalopathy, metabolic 06/14/2013  . Hypokalemia 09/18/2011  . ARF (acute renal failure) 09/17/2011  . CKD (chronic kidney disease) stage 3, GFR 30-59 ml/min 09/17/2011  . Normocytic anemia 09/17/2011  . CAD (coronary artery disease) of artery bypass graft 09/15/2011  . Acute hypernatremia 09/15/2011  . Unsteady gait 09/15/2011  . HTN (hypertension), benign 09/15/2011  . DM type 2 (diabetes mellitus, type 2) 09/15/2011    . Dementia, vascular with delirium 09/15/2011  . Closed intertrochanteric fracture of right hip 09/15/2011    History  Smoking status  . Never Smoker   Smokeless tobacco  . Never Used    History  Alcohol Use No    Family History  Problem Relation Age of Onset  . Coronary artery disease      Review of Systems: Constitutional: no fever chills diaphoresis or fatigue or change in weight.  Head and neck: no hearing loss, no epistaxis, no photophobia or visual disturbance. Respiratory: No cough, shortness of breath or wheezing. Cardiovascular: No chest pain peripheral edema, palpitations. Gastrointestinal: No abdominal distention, no abdominal pain, no change in bowel habits hematochezia or melena. Genitourinary: No dysuria, no frequency, no urgency, no nocturia. Musculoskeletal:No arthralgias, no back pain, no gait disturbance or myalgias. Neurological: No dizziness, no headaches, no numbness, no seizures, no syncope, no weakness, no tremors. Hematologic: No lymphadenopathy, no easy bruising. Psychiatric: No confusion, no hallucinations, no sleep disturbance.    Physical Exam: Filed Vitals:   06/20/13 1459  BP: 120/52  Pulse: 63   the general appearance reveals a pleasant elderly woman in no distress.The head and neck exam reveals pupils equal and reactive.  Extraocular movements are full.  There is no scleral icterus.  The mouth and pharynx are normal.  The neck is supple.  The carotids reveal no bruits.  The jugular venous pressure is normal.  The  thyroid is not enlarged.  There is no lymphadenopathy.  The chest is clear to percussion and auscultation.  There are no rales or rhonchi.  Expansion of the chest is symmetrical.  The precordium is quiet.  The first heart sound is normal.  The second heart sound is physiologically split.  There is no murmur gallop rub or click.  There is no abnormal lift or heave.  The abdomen is soft and nontender.  The bowel sounds are normal.  The  liver and spleen are not enlarged.  There are no abdominal masses.  There are no abdominal bruits.  Extremities reveal fair pedal pulses.  There is mild pretibial and pedal edema.  There is no cyanosis or clubbing.  Strength is normal and symmetrical in all extremities.  There is no lateralizing weakness.  There are no sensory deficits.  The skin is warm and dry.  There is no rash.     Assessment / Plan: The patient is to continue on same medication.  We filled out a handicap parking sticker for her today.  We gave her a prescription for some new Ted hose. She will continue with regular checkups at the [facility].  We  will plan to see her here on a as needed basis.  Today we drew a basal metabolic panel and a B. natruretic peptide.  We are starting the lisinopril 10 mg daily which she previously had been on.

## 2013-06-21 LAB — BASIC METABOLIC PANEL
BUN: 55 mg/dL — ABNORMAL HIGH (ref 6–23)
CO2: 23 mEq/L (ref 19–32)
Calcium: 9.4 mg/dL (ref 8.4–10.5)
Creatinine, Ser: 1.4 mg/dL — ABNORMAL HIGH (ref 0.4–1.2)

## 2013-06-21 LAB — BRAIN NATRIURETIC PEPTIDE: Pro B Natriuretic peptide (BNP): 249 pg/mL — ABNORMAL HIGH (ref 0.0–100.0)

## 2013-06-24 ENCOUNTER — Telehealth: Payer: Self-pay | Admitting: *Deleted

## 2013-06-24 DIAGNOSIS — I119 Hypertensive heart disease without heart failure: Secondary | ICD-10-CM

## 2013-06-24 MED ORDER — AMLODIPINE BESYLATE 2.5 MG PO TABS: 2.5000 mg | ORAL_TABLET | Freq: Every day | ORAL | Status: AC

## 2013-06-24 NOTE — Telephone Encounter (Signed)
Message copied by Burnell Blanks on Fri Jun 24, 2013  3:22 PM ------      Message from: Cassell Clement      Created: Tue Jun 21, 2013  1:36 PM       Please report.  The kidney function is worse. Continue to avoid lasix. Reduce lisinopril down to just 5 mg daily because of kidneys. Recheck BMET in about 1 week.  (If high blood pressure becomes a problem we can consider amlodipine). ------

## 2013-06-24 NOTE — Telephone Encounter (Signed)
Advised daughter. Daughter states blood pressure has been running high on full dose of Lisinopril. Will send over Rx for Amlodipine 2.5 mg daily. Will continue to monitor blood pressure and call back next week with updates

## 2013-06-29 ENCOUNTER — Other Ambulatory Visit: Payer: Medicare Other

## 2013-07-04 ENCOUNTER — Other Ambulatory Visit: Payer: Medicare Other

## 2013-07-07 ENCOUNTER — Other Ambulatory Visit: Payer: Self-pay | Admitting: Cardiology

## 2013-08-04 ENCOUNTER — Other Ambulatory Visit: Payer: Self-pay

## 2013-09-15 ENCOUNTER — Other Ambulatory Visit: Payer: Self-pay | Admitting: *Deleted

## 2013-09-15 DIAGNOSIS — F419 Anxiety disorder, unspecified: Secondary | ICD-10-CM

## 2013-09-15 MED ORDER — LORAZEPAM 0.5 MG PO TABS: 0.5000 mg | ORAL_TABLET | Freq: Three times a day (TID) | ORAL | Status: DC | PRN

## 2013-12-19 ENCOUNTER — Other Ambulatory Visit: Payer: Medicare Other

## 2013-12-21 ENCOUNTER — Ambulatory Visit: Payer: Medicare Other | Admitting: Cardiology

## 2013-12-21 ENCOUNTER — Other Ambulatory Visit: Payer: Medicare Other

## 2014-01-23 ENCOUNTER — Ambulatory Visit (INDEPENDENT_AMBULATORY_CARE_PROVIDER_SITE_OTHER): Payer: Medicare Other | Admitting: Physician Assistant

## 2014-01-23 ENCOUNTER — Encounter: Payer: Self-pay | Admitting: Physician Assistant

## 2014-01-23 VITALS — BP 120/58 | HR 64 | Ht 60.0 in | Wt 118.0 lb

## 2014-01-23 DIAGNOSIS — F0150 Vascular dementia without behavioral disturbance: Secondary | ICD-10-CM

## 2014-01-23 DIAGNOSIS — R410 Disorientation, unspecified: Secondary | ICD-10-CM

## 2014-01-23 DIAGNOSIS — R0989 Other specified symptoms and signs involving the circulatory and respiratory systems: Secondary | ICD-10-CM

## 2014-01-23 DIAGNOSIS — I10 Essential (primary) hypertension: Secondary | ICD-10-CM

## 2014-01-23 DIAGNOSIS — R0609 Other forms of dyspnea: Secondary | ICD-10-CM

## 2014-01-23 DIAGNOSIS — E785 Hyperlipidemia, unspecified: Secondary | ICD-10-CM

## 2014-01-23 DIAGNOSIS — N183 Chronic kidney disease, stage 3 unspecified: Secondary | ICD-10-CM

## 2014-01-23 DIAGNOSIS — I25810 Atherosclerosis of coronary artery bypass graft(s) without angina pectoris: Secondary | ICD-10-CM

## 2014-01-23 DIAGNOSIS — R0600 Dyspnea, unspecified: Secondary | ICD-10-CM | POA: Insufficient documentation

## 2014-01-23 DIAGNOSIS — F0151 Vascular dementia with behavioral disturbance: Secondary | ICD-10-CM

## 2014-01-23 LAB — BASIC METABOLIC PANEL
BUN: 43 mg/dL — ABNORMAL HIGH (ref 6–23)
CALCIUM: 9.3 mg/dL (ref 8.4–10.5)
CO2: 25 meq/L (ref 19–32)
Chloride: 109 mEq/L (ref 96–112)
Creatinine, Ser: 1 mg/dL (ref 0.4–1.2)
GFR: 52.9 mL/min — ABNORMAL LOW (ref >60.00)
Glucose, Bld: 152 mg/dL — ABNORMAL HIGH (ref 70–99)
Potassium: 4.2 mEq/L (ref 3.5–5.1)
SODIUM: 142 meq/L (ref 135–145)

## 2014-01-23 NOTE — Progress Notes (Signed)
HPI:  This is a 80 year old female patient Dr. Patty SermonsBrackbill who has history of coronary artery disease status post CABG in 1984 followed by several angioplasties all occurring in [place] before moving here. Her last echo was in 2007 showed normal systolic function ejection fraction 55-60% with grade 1 diastolic dysfunction, moderate aortic stenosis and AI and mild mitral stenosis and mild MR. She also has hypertension and chronic renal failure. She had an admission in 05/2013 for prerenal dehydration secondary to Lasix. She last saw Dr. Patty SermonsBrackbill after this hospitalization which time he resumed lisinopril for elevated blood pressure.  Her blood pressure remained high and amlodipine 2.5 mg was added  She also has history of metabolic encephalopathy, dementia, diabetes mellitus 2.   She comes in today accompanied by her daughter and aide. Her dementia has gotten much worse. She is now in a wheelchair or bedrest. She has trouble with dyspnea with very little activity. Her edema has been stable and BP is good. She denies chest pain or other cardiac complaints.  No Known Allergies  Current Outpatient Prescriptions on File Prior to Visit: amLODipine (NORVASC) 2.5 MG tablet, Take 1 tablet (2.5 mg total) by mouth daily., Disp: 30 tablet, Rfl: 5 B Complex Vitamins (VITAMIN B COMPLEX PO), Take 1 tablet by mouth daily.  , Disp: , Rfl:  BIOTIN PO, Take 1,000 mcg by mouth daily. , Disp: , Rfl:  Cholecalciferol (D3 ADULT PO), Take 2,000 mg by mouth daily. , Disp: , Rfl:   Coenzyme Q10 (COQ10 PO), Take 1 tablet by mouth daily.  , Disp: , Rfl:  fenofibrate 160 MG tablet, TAKE 1 TABLET ONCE DAILY., Disp: 30 tablet, Rfl: 6 insulin aspart (NOVOLOG) 100 UNIT/ML injection, Inject 1-5 Units into the skin 3 (three) times daily with meals., Disp: , Rfl:  insulin glargine (LANTUS) 100 UNIT/ML injection, Inject 8 Units into the skin every morning. , Disp: , Rfl:  levofloxacin (LEVAQUIN) 250 MG tablet, Take 2 tabs po on day 1  (9/18), then take 1 tab po daily until completed, Disp: 8 tablet, Rfl: 0 lisinopril (PRINIVIL,ZESTRIL) 10 MG tablet, Take 10 mg by mouth as directed. 1/2 tablet daily, Disp: , Rfl:  LORazepam (ATIVAN) 0.5 MG tablet, Take 1 tablet (0.5 mg total) by mouth 3 (three) times daily as needed for anxiety., Disp: 90 tablet, Rfl: 0 metoprolol succinate (TOPROL-XL) 25 MG 24 hr tablet, Take 25 mg by mouth daily., Disp: , Rfl:  NAMENDA 10 MG tablet, TAKE 1 TABLET TWICE DAILY., Disp: 60 tablet, Rfl: 6 nitroGLYCERIN (NITRODUR - DOSED IN MG/24 HR) 0.4 mg/hr, Place 3 patches (1.2 mg total) onto the skin daily., Disp: 30 patch, Rfl:  nitroGLYCERIN (NITROSTAT) 0.4 MG SL tablet, Place 0.4 mg under the tongue every 5 (five) minutes x 3 doses as needed. For chest pain. , Disp: , Rfl:  omeprazole (PRILOSEC) 20 MG capsule, Take 20 mg by mouth daily.  , Disp: , Rfl:  OVER THE COUNTER MEDICATION, Take 1 tablet by mouth every evening. Salus Floradix, Disp: , Rfl:  OVER THE COUNTER MEDICATION, Take 30 mLs by mouth every morning. Salus Floradix, Disp: , Rfl:  ZETIA 10 MG tablet, TAKE 1 TABLET ONCE DAILY., Disp: 30 tablet, Rfl: 6  No current facility-administered medications on file prior to visit.   Past Medical History:   Angina  Anemia                                                       Diabetes mellitus                                            Complication of anesthesia                                     Comment:ultra sensitive   Dementia                                                     Ischemic heart disease                                         Comment:remote CABG in 1992 with PCT and stent in 2000               while in [place]   HLD (hyperlipidemia)                                         HTN (hypertension)                                           Carotid bruit                                                  Comment:bilateral  Past Surgical  History:   CORONARY ARTERY BYPASS GRAFT                                    Comment:1984   coronary stents                                                 Comment:4 all together   COLON SURGERY                                    approx. 1*     Comment:colon tumor benign   SKIN CANCER EXCISION  Comment:face - squamous cell   NASAL RECONSTRUCTION                                            Comment:rebuilt due to squamous cell ca   FEMUR IM NAIL                                    09/16/2011     Comment:Procedure: INTRAMEDULLARY (IM) NAIL FEMORAL;                Surgeon: Toni Arthurs, MD;  Location: [facility];                Service: Orthopedics;  Laterality: Right;  Review of patient's family history indicates:   Coronary artery disease                                 Social History   Marital Status: Married             Spouse Name:                      Years of Education:                 Number of children:             Occupational History   None on file  Social History Main Topics   Smoking Status: Never Smoker                     Smokeless Status: Never Used                       Alcohol Use: No             Drug Use: No             Sexual Activity: No                 Other Topics            Concern   None on file  Social History Narrative   None on file    ROS: See history of present illness otherwise negative   PHYSICAL EXAM: Elderly, in a wheelchair, in no acute distress. Neck: No JVD, HJR, Bruit, or thyroid enlargement  Lungs: Decreased breath sounds but No tachypnea, clear without wheezing, rales, or rhonchi  Cardiovascular: RRR, PMI not displaced, 2/6 systolic murmur at the left sternal border and apex, no gallops, bruit, thrill, or heave.  Abdomen: BS normal. Soft without organomegaly, masses, lesions or tenderness.  Extremities: without cyanosis, clubbing or edema. Good distal pulses bilateral  SKin: Warm, no lesions  or rashes   Musculoskeletal: No deformities  Neuro: no focal signs  BP 120/58  Pulse 64  Ht 5' (1.524 m)  Wt 118 lb (53.524 kg)  BMI 23.05 kg/m2    EKG: Normal sinus rhythm with first degree AV block, LVH, no acute change

## 2014-01-23 NOTE — Assessment & Plan Note (Signed)
Patient has dyspnea with very little activity according to the daughter. Most of this is being transferred from her wheelchair to the car. This could be a combination of deconditioning and her moderate aortic stenosis. Overall she is doing remarkably well without any heart failure. She is getting good care a home. No change at this time.

## 2014-01-23 NOTE — Assessment & Plan Note (Signed)
Blood pressure is well-controlled on current medicines. We'll check renal function today. No changes.

## 2014-01-23 NOTE — Assessment & Plan Note (Signed)
Dementia seems to have worsened. Family is taking excellent care of her.

## 2014-01-23 NOTE — Assessment & Plan Note (Signed)
Stable without chest pain 

## 2014-01-23 NOTE — Patient Instructions (Addendum)
Your physician recommends that you schedule a follow-up appointment in: 2 MONTHS WITH DR. Patty SermonsBRACKBILL  Your physician recommends that you return for lab work in: TODAY BMET

## 2014-01-23 NOTE — Assessment & Plan Note (Signed)
Followup renal function on lisinopril today.

## 2014-01-25 ENCOUNTER — Telehealth: Payer: Self-pay | Admitting: *Deleted

## 2014-01-25 ENCOUNTER — Telehealth: Payer: Self-pay | Admitting: Cardiology

## 2014-01-25 NOTE — Telephone Encounter (Signed)
New message ° ° ° ° °Talk to Melinda °

## 2014-01-25 NOTE — Telephone Encounter (Signed)
LTCB FOR LAB RESULTS NUMBER PROVIDED

## 2014-01-25 NOTE — Telephone Encounter (Signed)
Left message to call back  

## 2014-02-06 ENCOUNTER — Other Ambulatory Visit: Payer: Self-pay | Admitting: Cardiology

## 2014-02-13 NOTE — Telephone Encounter (Signed)
Never heard back from patients daughter. Left another message if she needed something to call back.

## 2014-02-20 ENCOUNTER — Other Ambulatory Visit: Payer: Self-pay | Admitting: Cardiology

## 2014-04-02 ENCOUNTER — Other Ambulatory Visit: Payer: Self-pay | Admitting: Cardiology

## 2014-05-05 ENCOUNTER — Ambulatory Visit: Payer: Medicare Other | Admitting: Cardiology

## 2014-05-21 ENCOUNTER — Other Ambulatory Visit: Payer: Self-pay | Admitting: Cardiology

## 2014-05-30 ENCOUNTER — Encounter: Payer: Self-pay | Admitting: Cardiology

## 2014-05-30 ENCOUNTER — Ambulatory Visit (INDEPENDENT_AMBULATORY_CARE_PROVIDER_SITE_OTHER): Payer: Medicare Other | Admitting: Cardiology

## 2014-05-30 VITALS — BP 106/56 | HR 81 | Ht 60.0 in | Wt 105.0 lb

## 2014-05-30 DIAGNOSIS — I25810 Atherosclerosis of coronary artery bypass graft(s) without angina pectoris: Secondary | ICD-10-CM

## 2014-05-30 DIAGNOSIS — I359 Nonrheumatic aortic valve disorder, unspecified: Secondary | ICD-10-CM

## 2014-05-30 DIAGNOSIS — N289 Disorder of kidney and ureter, unspecified: Secondary | ICD-10-CM

## 2014-05-30 DIAGNOSIS — R410 Disorientation, unspecified: Secondary | ICD-10-CM

## 2014-05-30 DIAGNOSIS — I10 Essential (primary) hypertension: Secondary | ICD-10-CM

## 2014-05-30 DIAGNOSIS — F0151 Vascular dementia with behavioral disturbance: Secondary | ICD-10-CM

## 2014-05-30 DIAGNOSIS — N183 Chronic kidney disease, stage 3 unspecified: Secondary | ICD-10-CM

## 2014-05-30 DIAGNOSIS — I350 Nonrheumatic aortic (valve) stenosis: Secondary | ICD-10-CM | POA: Insufficient documentation

## 2014-05-30 DIAGNOSIS — F05 Delirium due to known physiological condition: Secondary | ICD-10-CM

## 2014-05-30 NOTE — Progress Notes (Signed)
Shelby Burns Date of Birth:  04/01/1917 [facility] [address] [place], [place]  [address] [phone]        Fax   [phone]   History of Present Illness: This 80 year old Caucasian female is seen or a scheduled followup office visit. She gets much of her medical care at the [facility]. She has a history of known ischemic heart disease. She had coronary artery bypass graft surgery in 1984 since then she has had several angioplasties. All these occurred prior to her moving here from [place] many years ago. Her last echocardiogram done at [facility] cardiology on 05/11/06 showed normal left ventricular systolic function with an ejection fraction of 55-60%. There was grade 1 diastolic dysfunction. There was moderate aortic stenosis and aortic insufficiency and mild mitral stenosis and mild mitral regurgitation.  The patient has a history of essential hypertension. She was  admitted to [facility] from 06/14/13 until 06/15/13 for acute renal failure secondary to prerenal dehydration secondary to Lasix. She was also noted to have metabolic encephalopathy and dementia as well as diabetes mellitus type 2 and a urinary tract infection.  The patient lives with her daughter and the daughter employs sitters to help with the mother's care.  The patient receives most of her medications through the [facility]. The patient's mentation has deteriorated significantly since last visit.  She has severe dementia.  Often she does not recognize her daughter.   Current Outpatient Prescriptions  Medication Sig Dispense Refill  . amLODipine (NORVASC) 2.5 MG tablet Take 1 tablet (2.5 mg total) by mouth daily.  30 tablet  5  . B Complex Vitamins (VITAMIN B COMPLEX PO) Take 1 tablet by mouth daily.        Marland Kitchen BIOTIN PO Take 1,000 mcg by mouth daily.       . Cholecalciferol (D3 ADULT PO) Take 2,000 mg by mouth daily.       . Coenzyme Q10 (COQ10 PO) Take 1 tablet by  mouth daily.        . fenofibrate 160 MG tablet TAKE 1 TABLET ONCE DAILY.  30 tablet  0  . insulin aspart (NOVOLOG) 100 UNIT/ML injection Inject 1-5 Units into the skin 3 (three) times daily with meals.      . insulin glargine (LANTUS) 100 UNIT/ML injection Inject 8 Units into the skin every morning.       Marland Kitchen lisinopril (PRINIVIL,ZESTRIL) 10 MG tablet Take 10 mg by mouth as directed. 1/2 tablet daily      . LORazepam (ATIVAN) 0.5 MG tablet TAKE 1 TABLET THREE TIMES DAILY AS NEEDED FOR ANXIETY.  90 tablet  1  . metoprolol succinate (TOPROL-XL) 25 MG 24 hr tablet Take 25 mg by mouth 2 (two) times daily.       Marland Kitchen NAMENDA 10 MG tablet TAKE 1 TABLET TWICE DAILY.  60 tablet  5  . nitroGLYCERIN (NITRODUR - DOSED IN MG/24 HR) 0.4 mg/hr Place 3 patches (1.2 mg total) onto the skin daily.  30 patch    . nitroGLYCERIN (NITROSTAT) 0.4 MG SL tablet Place 0.4 mg under the tongue every 5 (five) minutes x 3 doses as needed. For chest pain.       Marland Kitchen omeprazole (PRILOSEC) 20 MG capsule Take 20 mg by mouth daily.        Marland Kitchen OVER THE COUNTER MEDICATION Take 1 tablet by mouth every evening. Salus Floradix      . OVER THE COUNTER MEDICATION Take  30 mLs by mouth every morning. Salus Floradix      . ZETIA 10 MG tablet TAKE 1 TABLET ONCE DAILY.  30 tablet  0   No current facility-administered medications for this visit.    Allergies  Allergen Reactions  . Aspirin Other (See Comments)    Causes easy bleeding.    Patient Active Problem List   Diagnosis Date Noted  . Dyspnea 01/23/2014  . Dehydration 06/15/2013  . UTI (urinary tract infection) 06/14/2013  . Encephalopathy, metabolic 06/14/2013  . Hypokalemia 09/18/2011  . ARF (acute renal failure) 09/17/2011  . CKD (chronic kidney disease) stage 3, GFR 30-59 ml/min 09/17/2011  . Normocytic anemia 09/17/2011  . CAD (coronary artery disease) of artery bypass graft 09/15/2011  . Acute hypernatremia 09/15/2011  . Unsteady gait 09/15/2011  . HTN (hypertension),  benign 09/15/2011  . DM type 2 (diabetes mellitus, type 2) 09/15/2011  . Dementia, vascular with delirium 09/15/2011  . Closed intertrochanteric fracture of right hip 09/15/2011    History  Smoking status  . Never Smoker   Smokeless tobacco  . Never Used    History  Alcohol Use No    Family History  Problem Relation Age of Onset  . Coronary artery disease      Review of Systems: Constitutional: no fever chills diaphoresis or fatigue or change in weight.  Head and neck: no hearing loss, no epistaxis, no photophobia or visual disturbance. Respiratory: No cough, shortness of breath or wheezing. Cardiovascular: No chest pain peripheral edema, palpitations. Gastrointestinal: No abdominal distention, no abdominal pain, no change in bowel habits hematochezia or melena. Genitourinary: No dysuria, no frequency, no urgency, no nocturia. Musculoskeletal:No arthralgias, no back pain, no gait disturbance or myalgias. Neurological: No dizziness, no headaches, no numbness, no seizures, no syncope, no weakness, no tremors. Hematologic: No lymphadenopathy, no easy bruising. Psychiatric: No confusion, no hallucinations, no sleep disturbance.    Physical Exam: Filed Vitals:   05/30/14 1622  BP: 106/56  Pulse: 81  The patient appears to be in no distress.  She is essentially nonverbal.  Head and neck exam reveals that the pupils are equal and reactive.  The extraocular movements are full.  There is no scleral icterus.  Mouth and pharynx are benign.  No lymphadenopathy.  No carotid bruits.  The jugular venous pressure is normal.  Thyroid is not enlarged or tender.  Chest is clear to percussion and auscultation.  No rales or rhonchi.  Expansion of the chest is symmetrical.  Heart reveals no abnormal lift or heave.  First and second heart sounds are normal.  There is grade 3/6 musical systolic ejection murmur at the base.  No diastolic murmur.  No gallop.  The abdomen is soft and nontender.   Bowel sounds are normoactive.  There is no hepatosplenomegaly or mass.  There are no abdominal bruits.  Extremities reveal no phlebitis.  There is trace edema..  Pedal pulses are good.  There is no cyanosis or clubbing.  Neurologic exam is normal strength and no lateralizing weakness.  No sensory deficits.  Integument reveals no rash    Assessment / Plan: 1. ischemic heart disease status post CABG in 1984 2. moderate aortic stenosis 3. severe dementia 4. diabetes mellitus type 2 5. chronic kidney disease stage III 6. Hypercholesterolemia  Plan: Continue same medication.  Hospital bed is broken and we gave her a request for a new electric hospital bed. Recheck here in one year for office visit and EKG or sooner when necessary.

## 2014-05-30 NOTE — Assessment & Plan Note (Signed)
The patient has not been able to express how she feels.  There has been no definite recent chest pain.  The daughter states that if they fail to put a nitroglycerin patch on the patient, she becomes very lethargic.

## 2014-05-30 NOTE — Patient Instructions (Signed)
Your physician recommends that you continue on your current medications as directed. Please refer to the Current Medication list given to you today.  Your physician wants you to follow-up in: 1 year ov/ekg You will receive a reminder letter in the mail two months in advance. If you don't receive a letter, please call our office to schedule the follow-up appointment.  

## 2014-05-30 NOTE — Assessment & Plan Note (Signed)
Blood pressure it has been stable on current therapy.

## 2014-05-30 NOTE — Assessment & Plan Note (Signed)
Dementia is more severe.  She frequently lashes out at her sitters and her daughter.

## 2014-06-13 ENCOUNTER — Other Ambulatory Visit: Payer: Self-pay | Admitting: *Deleted

## 2014-06-13 MED ORDER — FENOFIBRATE 160 MG PO TABS: ORAL_TABLET | ORAL | Status: DC

## 2014-06-21 ENCOUNTER — Other Ambulatory Visit: Payer: Self-pay | Admitting: Cardiology

## 2014-06-21 DIAGNOSIS — F419 Anxiety disorder, unspecified: Secondary | ICD-10-CM

## 2014-07-14 ENCOUNTER — Other Ambulatory Visit: Payer: Self-pay

## 2014-09-26 ENCOUNTER — Other Ambulatory Visit: Payer: Self-pay | Admitting: Cardiology

## 2014-11-01 ENCOUNTER — Other Ambulatory Visit: Payer: Self-pay | Admitting: *Deleted

## 2014-11-01 DIAGNOSIS — F419 Anxiety disorder, unspecified: Secondary | ICD-10-CM

## 2014-11-02 MED ORDER — LORAZEPAM 0.5 MG PO TABS: ORAL_TABLET | ORAL | Status: AC

## 2015-02-02 ENCOUNTER — Other Ambulatory Visit: Payer: Self-pay | Admitting: Cardiology

## 2015-02-28 DEATH — deceased

## 2015-03-26 ENCOUNTER — Other Ambulatory Visit: Payer: Self-pay

## 2015-04-03 IMAGING — US US RENAL
1 series · 14 of 25 positions shown · non-contrast
Comparison: None.

CLINICAL DATA: Concern for obstruction.

EXAM:
RENAL/URINARY TRACT ULTRASOUND COMPLETE

[Series 1: us renal · 0.18mm/px · 14 of 31 slices shown]
[im 1/31]
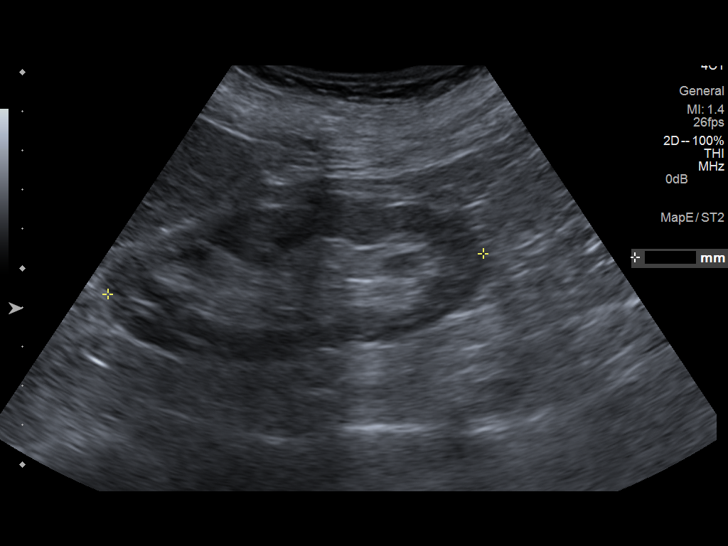
[im 3/31]
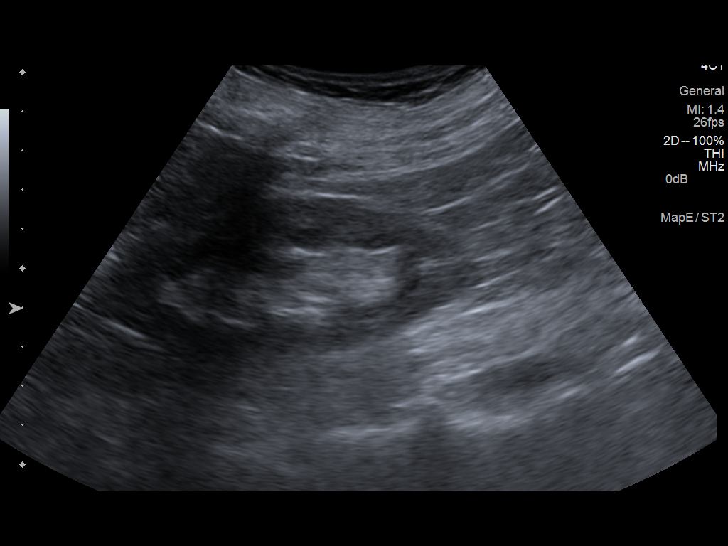
[im 6/31]
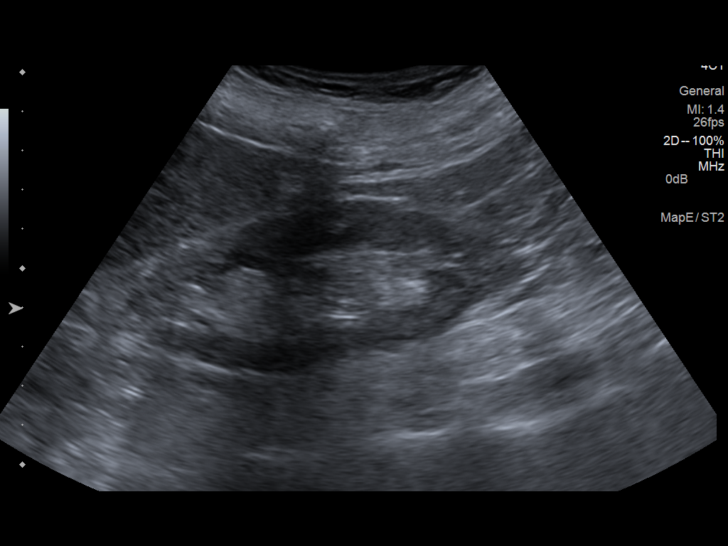
[im 8/31]
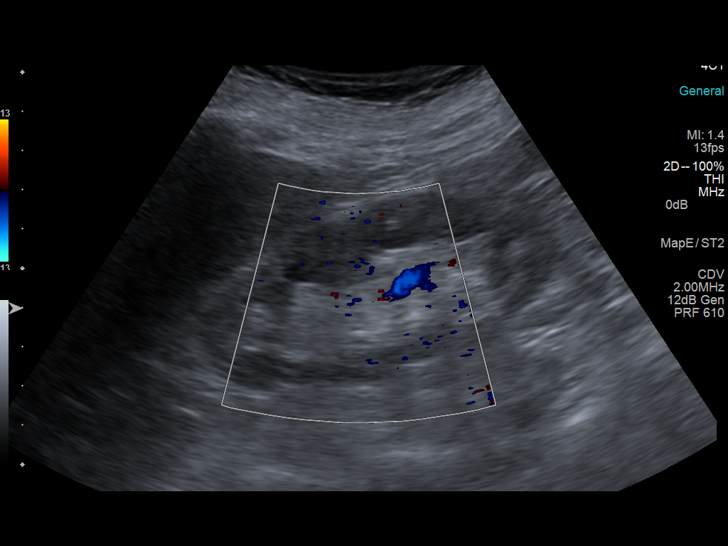
[im 11/31]
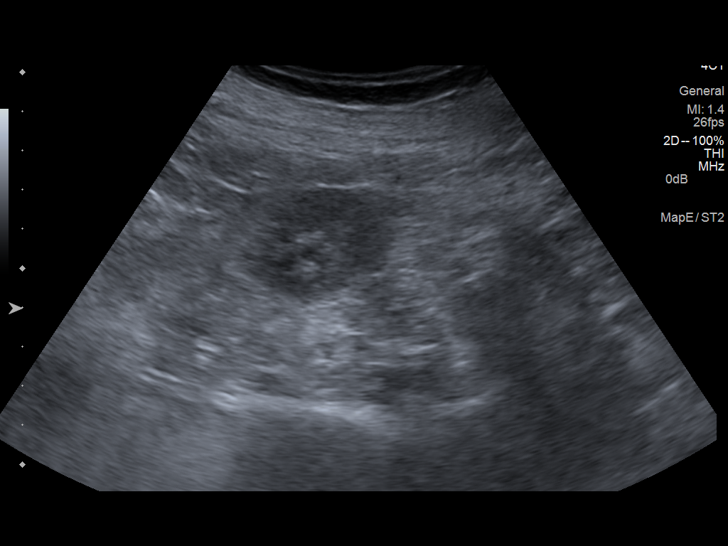
[im 12/31]
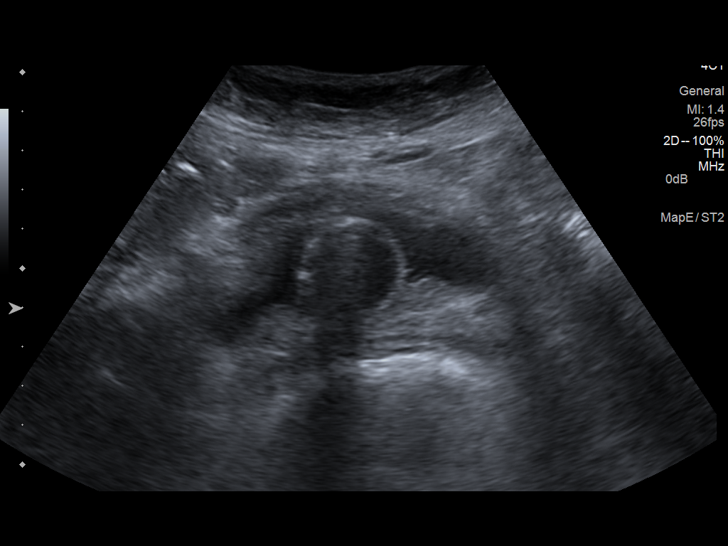
[im 14/31]
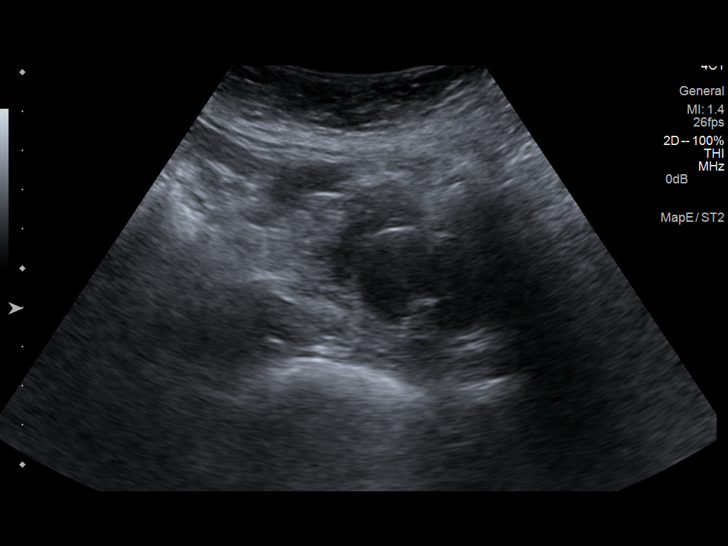
[im 17/31]
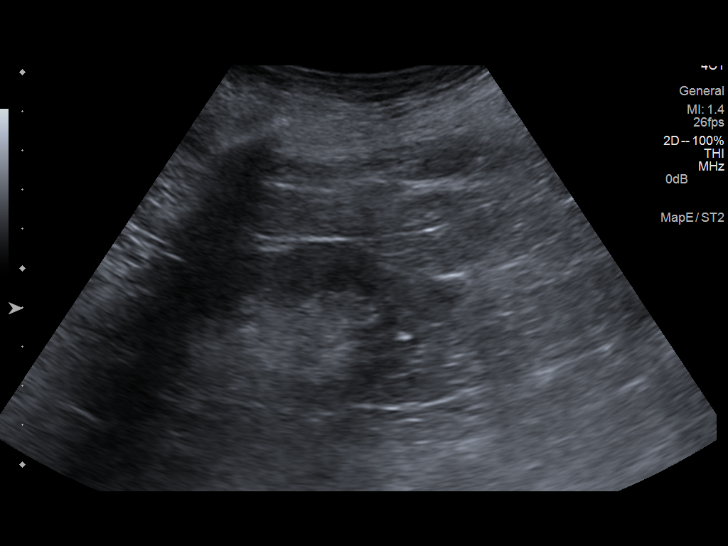
[im 19/31]
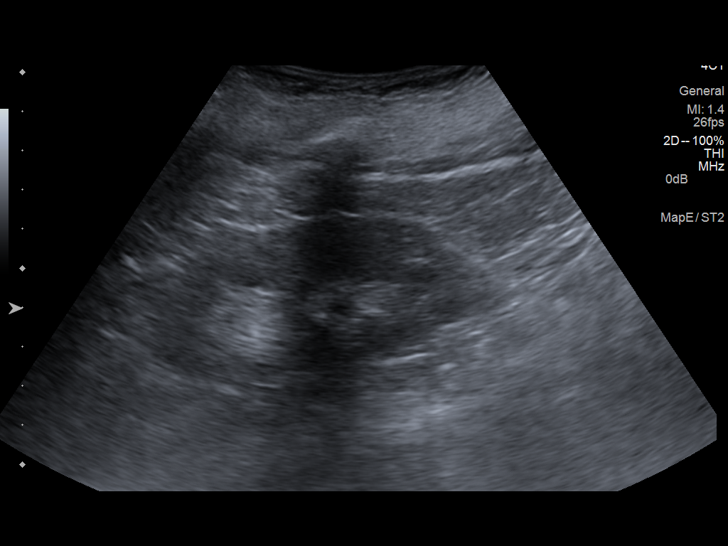
[im 21/31]
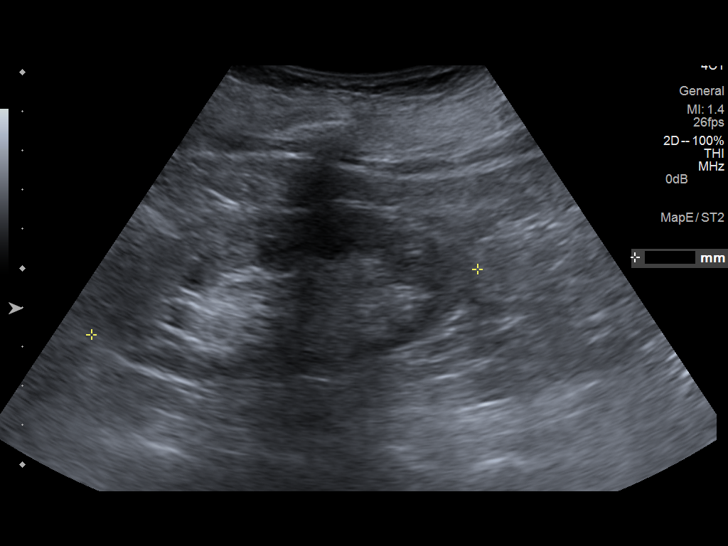
[im 23/31]
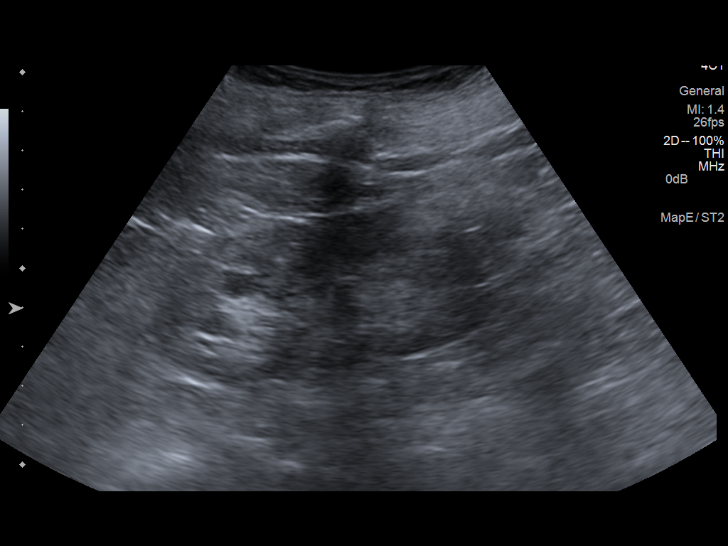
[im 26/31]
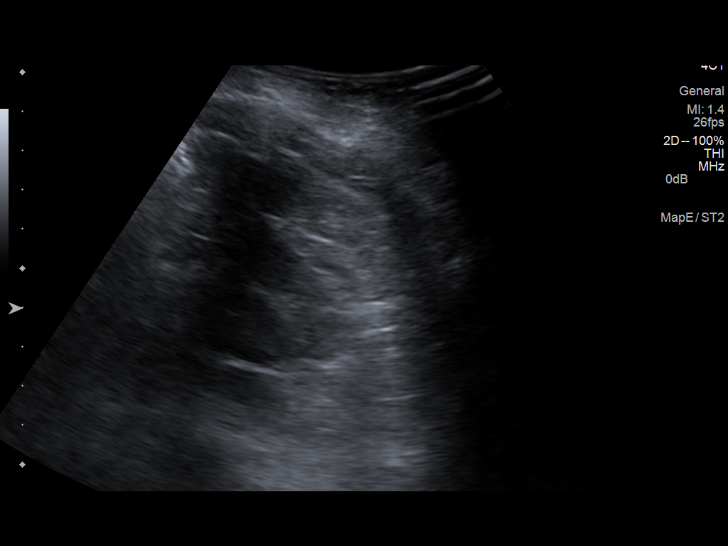
[im 28/31]
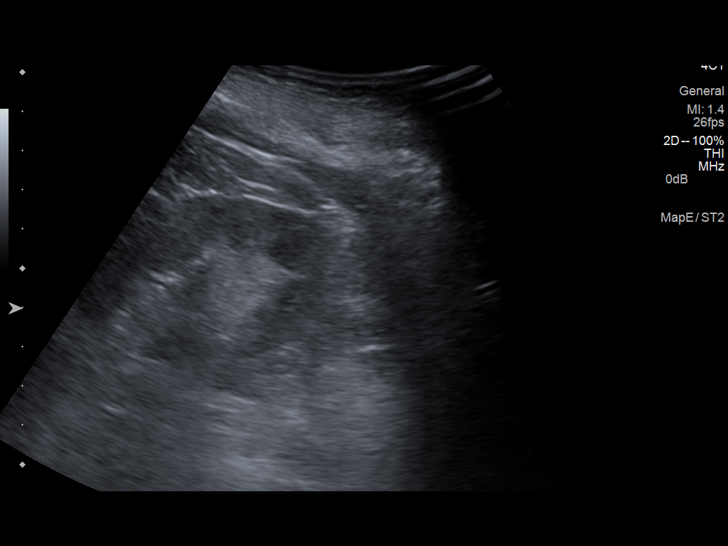
[im 31/31]
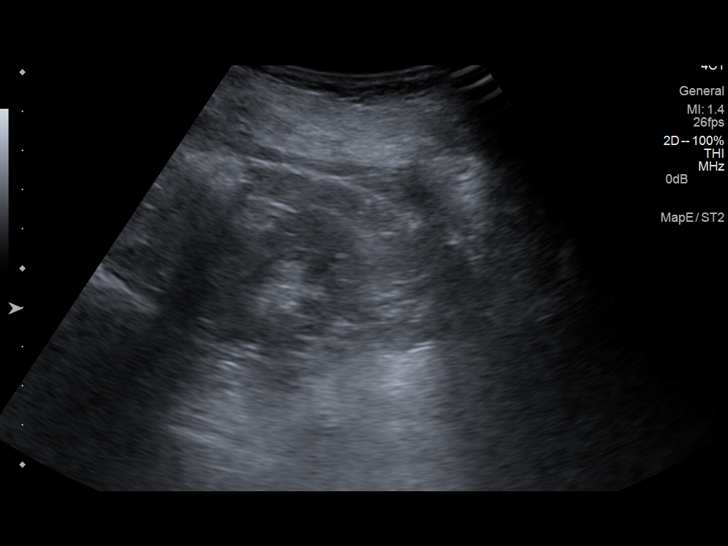

[14 of 25 positions shown; findings below may reference images not displayed]

FINDINGS: Right Kidney: Right kidney measures 9.6 cm in length. Negative for
right hydronephrosis. Normal appearance of the renal parenchyma.

Left Kidney: Left kidney measures 10.0 cm in length without
hydronephrosis. Normal appearance of the renal parenchyma. Trace
amount of perinephric fluid along the superior aspect of the left
kidney.

Bladder:  Decompressed with a Foley catheter.
IMPRESSION: Negative for hydronephrosis.

Trace amount of left perinephric fluid is nonspecific.
